# Patient Record
Sex: Female | Born: 1990 | Race: Black or African American | Hispanic: No | Marital: Single | Smoking: Former smoker
Health system: Southern US, Community
[De-identification: ages and names within clinical notes are randomized; demographics above are authoritative.]

## PROBLEM LIST (undated history)

## (undated) DIAGNOSIS — D649 Anemia, unspecified: Secondary | ICD-10-CM

## (undated) DIAGNOSIS — J45909 Unspecified asthma, uncomplicated: Secondary | ICD-10-CM

## (undated) HISTORY — PX: NO PAST SURGERIES: SHX2092

---
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
## 2010-01-31 ENCOUNTER — Emergency Department (HOSPITAL_COMMUNITY): Admission: EM | Admit: 2010-01-31 | Discharge: 2010-01-31 | Payer: Self-pay | Admitting: Emergency Medicine

## 2010-10-30 ENCOUNTER — Other Ambulatory Visit
Admission: RE | Admit: 2010-10-30 | Discharge: 2010-10-30 | Payer: Self-pay | Source: Home / Self Care | Admitting: Family Medicine

## 2011-02-17 LAB — URINALYSIS, ROUTINE W REFLEX MICROSCOPIC
Bilirubin Urine: NEGATIVE
Glucose, UA: NEGATIVE mg/dL
Hgb urine dipstick: NEGATIVE
Nitrite: NEGATIVE
Protein, ur: NEGATIVE mg/dL
Specific Gravity, Urine: 1.017 (ref 1.005–1.030)
Urobilinogen, UA: 0.2 mg/dL (ref 0.0–1.0)
pH: 5.5 (ref 5.0–8.0)

## 2011-02-17 LAB — WET PREP, GENITAL

## 2011-02-17 LAB — URINE MICROSCOPIC-ADD ON

## 2011-02-17 LAB — URINE CULTURE

## 2011-02-17 LAB — GC/CHLAMYDIA PROBE AMP, GENITAL
Chlamydia, DNA Probe: POSITIVE — AB
GC Probe Amp, Genital: NEGATIVE

## 2013-11-24 NOTE — L&D Delivery Note (Signed)
I have seen and examined this patient and I agree with the above. Cam HaiSHAW, KIMBERLY CNM 12:36 AM 06/22/2014

## 2013-11-24 NOTE — L&D Delivery Note (Signed)
Delivery Note At 2352 a healthy female was delivered via spontaneous vaginal delivery  (Presentation:occiput anterior ;  ).  Placenta status: Intact .  Cord: 3 vessel  with the following complications: None  Called to bedside when patient was complete and pushing. After 30 minutes, head was delivered OA over an intact perineum. There was a loose nuchal cord that was easily reduced. Shoulders were delivered and body was extracted from the vagina. Cord was cut and clamped and baby was handed to mom for skin to skin. Using gentle traction, cord and placenta were delivered and found to be intact. Cord blood was obtained. There was significant vaginal bleeding that did not immediately improve with uterine massage and 1000mcg cytotec was given orally. Bleeding slowed and she was noted to have a 1st degree vaginal laceration that was bleeding and repaired with 3-0 vicryl in usual fashion. Vaginal bleeding was very slow afterwards. Uterus was firm. Lap and needle count were correct. Mom and baby left in good condition. Will check again to see if bleeding resolved. Check CBC in the morning. Mom and baby to postpartum.  Anesthesia:  Epidural Episiotomy: None Lacerations: 1st degree vaginal Suture Repair: 3.0 Est. Blood Loss (mL): 450  Mom to postpartum.  Baby to Nursery.  Delivery attended by Francene FindersKimberly Shaw  Germani Gavilanes A 06/22/2014, 12:25 AM

## 2014-02-22 LAB — OB RESULTS CONSOLE RUBELLA ANTIBODY, IGM: Rubella: IMMUNE

## 2014-02-22 LAB — OB RESULTS CONSOLE ABO/RH: RH Type: POSITIVE

## 2014-02-22 LAB — OB RESULTS CONSOLE HEPATITIS B SURFACE ANTIGEN: Hepatitis B Surface Ag: NEGATIVE

## 2014-02-22 LAB — OB RESULTS CONSOLE RPR: RPR: NONREACTIVE

## 2014-02-22 LAB — OB RESULTS CONSOLE HIV ANTIBODY (ROUTINE TESTING): HIV: NONREACTIVE

## 2014-02-22 LAB — OB RESULTS CONSOLE ANTIBODY SCREEN: ANTIBODY SCREEN: NEGATIVE

## 2014-05-22 ENCOUNTER — Encounter: Payer: Self-pay | Admitting: Obstetrics & Gynecology

## 2014-05-22 ENCOUNTER — Ambulatory Visit (INDEPENDENT_AMBULATORY_CARE_PROVIDER_SITE_OTHER): Payer: Medicaid Other | Admitting: Obstetrics & Gynecology

## 2014-05-22 VITALS — BP 120/84 | HR 72 | Ht 70.0 in | Wt 198.0 lb

## 2014-05-22 DIAGNOSIS — Z3685 Encounter for antenatal screening for Streptococcus B: Secondary | ICD-10-CM

## 2014-05-22 DIAGNOSIS — Z3400 Encounter for supervision of normal first pregnancy, unspecified trimester: Secondary | ICD-10-CM

## 2014-05-22 DIAGNOSIS — Z3403 Encounter for supervision of normal first pregnancy, third trimester: Secondary | ICD-10-CM

## 2014-05-22 DIAGNOSIS — Z113 Encounter for screening for infections with a predominantly sexual mode of transmission: Secondary | ICD-10-CM

## 2014-05-22 LAB — OB RESULTS CONSOLE GC/CHLAMYDIA
Chlamydia: NEGATIVE
Gonorrhea: NEGATIVE

## 2014-05-22 LAB — OB RESULTS CONSOLE GBS: STREP GROUP B AG: NEGATIVE

## 2014-05-22 NOTE — Progress Notes (Signed)
[facility] requested. Cervical cultures today. Labor precautions reviewed.

## 2014-05-22 NOTE — Progress Notes (Signed)
[facility]. Records have been requested.

## 2014-05-23 LAB — GC/CHLAMYDIA PROBE AMP
CT PROBE, AMP APTIMA: NEGATIVE
GC PROBE AMP APTIMA: NEGATIVE

## 2014-05-24 LAB — CULTURE, BETA STREP (GROUP B ONLY)

## 2014-05-29 ENCOUNTER — Ambulatory Visit (INDEPENDENT_AMBULATORY_CARE_PROVIDER_SITE_OTHER): Payer: Medicaid Other | Admitting: Obstetrics & Gynecology

## 2014-05-29 VITALS — BP 120/82 | HR 68 | Wt 200.0 lb

## 2014-05-29 DIAGNOSIS — Z3490 Encounter for supervision of normal pregnancy, unspecified, unspecified trimester: Secondary | ICD-10-CM | POA: Insufficient documentation

## 2014-05-29 DIAGNOSIS — Z3400 Encounter for supervision of normal first pregnancy, unspecified trimester: Secondary | ICD-10-CM

## 2014-05-29 DIAGNOSIS — Z3493 Encounter for supervision of normal pregnancy, unspecified, third trimester: Secondary | ICD-10-CM

## 2014-05-29 NOTE — Progress Notes (Signed)
No records from [facility] yet, will investigate.  No cervical change since last week.  No other complaints or concerns.  Fetal movement and labor precautions reviewed.

## 2014-05-29 NOTE — Patient Instructions (Signed)
Return to clinic for any obstetric concerns or go to MAU for evaluation  

## 2014-06-01 ENCOUNTER — Encounter: Payer: Self-pay | Admitting: Obstetrics & Gynecology

## 2014-06-05 ENCOUNTER — Encounter: Payer: Self-pay | Admitting: Obstetrics and Gynecology

## 2014-06-05 ENCOUNTER — Ambulatory Visit (INDEPENDENT_AMBULATORY_CARE_PROVIDER_SITE_OTHER): Payer: Medicaid Other | Admitting: Obstetrics and Gynecology

## 2014-06-05 VITALS — BP 110/77 | HR 68 | Wt 200.0 lb

## 2014-06-05 DIAGNOSIS — D573 Sickle-cell trait: Secondary | ICD-10-CM

## 2014-06-05 DIAGNOSIS — Z3493 Encounter for supervision of normal pregnancy, unspecified, third trimester: Secondary | ICD-10-CM

## 2014-06-05 DIAGNOSIS — Z3480 Encounter for supervision of other normal pregnancy, unspecified trimester: Secondary | ICD-10-CM

## 2014-06-05 NOTE — Progress Notes (Signed)
Patient is doing well without complaints. FM/labor precautions reviewed 

## 2014-06-12 ENCOUNTER — Ambulatory Visit (INDEPENDENT_AMBULATORY_CARE_PROVIDER_SITE_OTHER): Payer: Medicaid Other | Admitting: Obstetrics & Gynecology

## 2014-06-12 VITALS — BP 121/83 | HR 60 | Wt 204.0 lb

## 2014-06-12 DIAGNOSIS — Z3400 Encounter for supervision of normal first pregnancy, unspecified trimester: Secondary | ICD-10-CM

## 2014-06-12 DIAGNOSIS — Z3493 Encounter for supervision of normal pregnancy, unspecified, third trimester: Secondary | ICD-10-CM

## 2014-06-12 NOTE — Progress Notes (Signed)
[facility].  Nml glucola and anatomy.

## 2014-06-19 ENCOUNTER — Ambulatory Visit (INDEPENDENT_AMBULATORY_CARE_PROVIDER_SITE_OTHER): Payer: Medicaid Other | Admitting: Obstetrics & Gynecology

## 2014-06-19 VITALS — BP 112/82 | HR 59 | Wt 207.0 lb

## 2014-06-19 DIAGNOSIS — Z3480 Encounter for supervision of other normal pregnancy, unspecified trimester: Secondary | ICD-10-CM

## 2014-06-19 DIAGNOSIS — Z3493 Encounter for supervision of normal pregnancy, unspecified, third trimester: Secondary | ICD-10-CM

## 2014-06-19 NOTE — Progress Notes (Signed)
Routine visit. Good FM. Labor precautions reviewed. Start testing at next visit.

## 2014-06-21 ENCOUNTER — Inpatient Hospital Stay (HOSPITAL_COMMUNITY)
Admission: AD | Admit: 2014-06-21 | Discharge: 2014-06-21 | Disposition: A | Discharge status: Home / Self Care | Payer: Medicaid Other | Source: Ambulatory Visit | Attending: Obstetrics & Gynecology | Admitting: Obstetrics & Gynecology

## 2014-06-21 ENCOUNTER — Encounter (HOSPITAL_COMMUNITY): Payer: Self-pay | Admitting: *Deleted

## 2014-06-21 ENCOUNTER — Encounter (HOSPITAL_COMMUNITY): Payer: Medicaid Other | Admitting: Anesthesiology

## 2014-06-21 ENCOUNTER — Inpatient Hospital Stay (HOSPITAL_COMMUNITY)
Admission: AD | Admit: 2014-06-21 | Discharge: 2014-06-23 | DRG: 775 | Disposition: A | Discharge status: Home / Self Care | Payer: Medicaid Other | Source: Ambulatory Visit | Attending: Obstetrics and Gynecology | Admitting: Obstetrics and Gynecology

## 2014-06-21 ENCOUNTER — Inpatient Hospital Stay (HOSPITAL_COMMUNITY): Payer: Medicaid Other | Admitting: Anesthesiology

## 2014-06-21 DIAGNOSIS — J45909 Unspecified asthma, uncomplicated: Secondary | ICD-10-CM | POA: Diagnosis present

## 2014-06-21 DIAGNOSIS — O479 False labor, unspecified: Secondary | ICD-10-CM | POA: Insufficient documentation

## 2014-06-21 DIAGNOSIS — Z87891 Personal history of nicotine dependence: Secondary | ICD-10-CM

## 2014-06-21 DIAGNOSIS — Z880 Allergy status to penicillin: Secondary | ICD-10-CM | POA: Diagnosis not present

## 2014-06-21 DIAGNOSIS — IMO0001 Reserved for inherently not codable concepts without codable children: Secondary | ICD-10-CM

## 2014-06-21 DIAGNOSIS — Z3493 Encounter for supervision of normal pregnancy, unspecified, third trimester: Secondary | ICD-10-CM

## 2014-06-21 HISTORY — DX: Anemia, unspecified: D64.9

## 2014-06-21 HISTORY — DX: Unspecified asthma, uncomplicated: J45.909

## 2014-06-21 LAB — CBC
HCT: 35.5 % — ABNORMAL LOW (ref 36.0–46.0)
HEMOGLOBIN: 12.7 g/dL (ref 12.0–15.0)
MCH: 33.2 pg (ref 26.0–34.0)
MCHC: 35.8 g/dL (ref 30.0–36.0)
MCV: 92.9 fL (ref 78.0–100.0)
Platelets: 252 10*3/uL (ref 150–400)
RBC: 3.82 MIL/uL — ABNORMAL LOW (ref 3.87–5.11)
RDW: 12.3 % (ref 11.5–15.5)
WBC: 10.6 10*3/uL — AB (ref 4.0–10.5)

## 2014-06-21 MED ORDER — LACTATED RINGERS IV SOLN
INTRAVENOUS | Status: DC
  Administered 2014-06-21: 23:00:00 via INTRAVENOUS

## 2014-06-21 MED ORDER — FENTANYL CITRATE 0.05 MG/ML IJ SOLN
INTRAMUSCULAR | Status: AC
  Administered 2014-06-21: 100 ug via INTRAVENOUS
  Filled 2014-06-21: qty 2

## 2014-06-21 MED ORDER — FENTANYL 2.5 MCG/ML BUPIVACAINE 1/10 % EPIDURAL INFUSION (WH - ANES)
INTRAMUSCULAR | Status: DC | PRN
  Administered 2014-06-21: 14 mL/h via EPIDURAL

## 2014-06-21 MED ORDER — FENTANYL 2.5 MCG/ML BUPIVACAINE 1/10 % EPIDURAL INFUSION (WH - ANES)
14.0000 mL/h | INTRAMUSCULAR | Status: DC | PRN
  Administered 2014-06-21: 14 mL/h via EPIDURAL

## 2014-06-21 MED ORDER — CITRIC ACID-SODIUM CITRATE 334-500 MG/5ML PO SOLN: 30.0000 mL | ORAL | Status: DC | PRN

## 2014-06-21 MED ORDER — LACTATED RINGERS IV SOLN: 500.0000 mL | INTRAVENOUS | Status: DC | PRN

## 2014-06-21 MED ORDER — EPHEDRINE 5 MG/ML INJ: 10.0000 mg | INTRAVENOUS | Status: DC | PRN

## 2014-06-21 MED ORDER — PHENYLEPHRINE 40 MCG/ML (10ML) SYRINGE FOR IV PUSH (FOR BLOOD PRESSURE SUPPORT): 80.0000 ug | PREFILLED_SYRINGE | INTRAVENOUS | Status: DC | PRN

## 2014-06-21 MED ORDER — PHENYLEPHRINE 40 MCG/ML (10ML) SYRINGE FOR IV PUSH (FOR BLOOD PRESSURE SUPPORT)
PREFILLED_SYRINGE | INTRAVENOUS | Status: AC
  Filled 2014-06-21: qty 10

## 2014-06-21 MED ORDER — ONDANSETRON HCL 4 MG/2ML IJ SOLN
4.0000 mg | Freq: Four times a day (QID) | INTRAMUSCULAR | Status: DC | PRN
Start: 2014-06-21 — End: 2014-06-22

## 2014-06-21 MED ORDER — FENTANYL 2.5 MCG/ML BUPIVACAINE 1/10 % EPIDURAL INFUSION (WH - ANES)
INTRAMUSCULAR | Status: AC
  Administered 2014-06-21: 23:00:00
  Filled 2014-06-21: qty 125

## 2014-06-21 MED ORDER — ACETAMINOPHEN 325 MG PO TABS: 650.0000 mg | ORAL_TABLET | ORAL | Status: DC | PRN

## 2014-06-21 MED ORDER — OXYTOCIN BOLUS FROM INFUSION
500.0000 mL | INTRAVENOUS | Status: DC
  Administered 2014-06-22: 500 mL via INTRAVENOUS

## 2014-06-21 MED ORDER — DIPHENHYDRAMINE HCL 50 MG/ML IJ SOLN: 12.5000 mg | INTRAMUSCULAR | Status: DC | PRN

## 2014-06-21 MED ORDER — OXYCODONE-ACETAMINOPHEN 5-325 MG PO TABS: 1.0000 | ORAL_TABLET | ORAL | Status: DC | PRN

## 2014-06-21 MED ORDER — IBUPROFEN 600 MG PO TABS: 600.0000 mg | ORAL_TABLET | Freq: Four times a day (QID) | ORAL | Status: DC | PRN

## 2014-06-21 MED ORDER — LIDOCAINE HCL (PF) 1 % IJ SOLN
INTRAMUSCULAR | Status: DC | PRN
  Administered 2014-06-21: 5 mL
  Administered 2014-06-21: 3 mL
  Administered 2014-06-21: 5 mL

## 2014-06-21 MED ORDER — FENTANYL CITRATE 0.05 MG/ML IJ SOLN
100.0000 ug | INTRAMUSCULAR | Status: DC | PRN
  Administered 2014-06-21: 100 ug via INTRAVENOUS

## 2014-06-21 MED ORDER — LIDOCAINE HCL (PF) 1 % IJ SOLN
30.0000 mL | INTRAMUSCULAR | Status: DC | PRN
  Administered 2014-06-22: 30 mL via SUBCUTANEOUS
  Filled 2014-06-21: qty 30

## 2014-06-21 MED ORDER — OXYTOCIN 40 UNITS IN LACTATED RINGERS INFUSION - SIMPLE MED
62.5000 mL/h | INTRAVENOUS | Status: DC
  Filled 2014-06-21: qty 1000

## 2014-06-21 MED ORDER — LACTATED RINGERS IV SOLN
500.0000 mL | Freq: Once | INTRAVENOUS | Status: AC
  Administered 2014-06-21: 500 mL via INTRAVENOUS

## 2014-06-21 NOTE — MAU Note (Signed)
Pt reports she has had ctx all day here in am 3cm. Deneis SROM or vag bleeding at this time.

## 2014-06-21 NOTE — MAU Note (Signed)
Contractions started at midnight. "back to back, but have now slowed". Small amt of bloody mucous. No leaking.  First preg; no problems was 1+ when last checked.

## 2014-06-21 NOTE — Anesthesia Procedure Notes (Signed)
Epidural Patient location during procedure: OB  Staffing Anesthesiologist: Phillips GroutARIGNAN, Kegan Mckeithan Performed by: anesthesiologist   Preanesthetic Checklist Completed: patient identified, site marked, surgical consent, pre-op evaluation, timeout performed, IV checked, risks and benefits discussed and monitors and equipment checked  Epidural Patient position: sitting Prep: ChloraPrep Patient monitoring: heart rate, continuous pulse ox and blood pressure Approach: midline Location: L3-L4 Injection technique: LOR saline  Needle:  Needle type: Tuohy  Needle gauge: 17 G Needle length: 9 cm and 9 Needle insertion depth: 4 cm Catheter type: closed end flexible Catheter size: 20 Guage Catheter at skin depth: 9 cm Test dose: negative  Assessment Events: blood not aspirated, injection not painful, no injection resistance, negative IV test and no paresthesia  Additional Notes   Patient tolerated the insertion well without complications.

## 2014-06-21 NOTE — MAU Provider Note (Signed)
Mckenzie DossShanteria N Mccoy is a 23 y.o. female G1P0 at 3285w1d gestation presenting for contractions. Maternal Medical History:  Reason for admission: Nausea.     Patient began having contractions at midnight last night. She was checked in the MAU and found to be 2 cm. She returned when contractions became more severe and frequent. Now every 5 minutes and she cannot tolerate them any longer. No leakage of fluid, no vaginal discharge. Some very minimal spotting. No headaches. Patient is GBS negative. No complications during this pregnancy. Patient desires epidural.  OB History   Grav Para Term Preterm Abortions TAB SAB Ect Mult Living   1         0     Past Medical History  Diagnosis Date  . Asthma   . Anemia    Past Surgical History  Procedure Laterality Date  . No past surgeries     Family History: family history is not on file. Social History:  reports that she quit smoking about 2 months ago. Her smoking use included Cigarettes. She smoked 0.25 packs per day. She does not have any smokeless tobacco history on file. She reports that she does not drink alcohol or use illicit drugs.   Prenatal Transfer Tool  Maternal Diabetes: No Genetic Screening: Normal Maternal Ultrasounds/Referrals: Normal Fetal Ultrasounds or other Referrals:  None Maternal Substance Abuse:  No Significant Maternal Medications:  None Significant Maternal Lab Results:  None Other Comments:  None  Review of Systems  Constitutional: Negative for fever and chills.  HENT: Negative for hearing loss.   Eyes: Negative for blurred vision and double vision.  Respiratory: Negative for cough and sputum production.   Cardiovascular: Negative for chest pain and palpitations.  Gastrointestinal: Positive for abdominal pain. Negative for heartburn, nausea, vomiting and diarrhea.  Genitourinary: Negative for dysuria and urgency.  Musculoskeletal: Negative for myalgias.  Neurological: Negative for dizziness, tingling and  headaches.    Dilation: 4 Effacement (%): 90 Station: -1 Exam by:: K.WIlson,RN Last menstrual period 09/13/2013. Exam Physical Exam  Constitutional: She is oriented to person, place, and time. She appears well-developed and well-nourished.  23 year old female on hands and knees, appears very uncomfortable  Cardiovascular: Normal rate and regular rhythm.   Respiratory: Effort normal and breath sounds normal.  GI: Soft.  Gravid, non-tender  Genitourinary: Vagina normal.  Musculoskeletal: She exhibits edema. She exhibits no tenderness.  Neurological: She is alert and oriented to person, place, and time.  Skin: Skin is warm and dry.    Prenatal labs: ABO, Rh: B/Positive/-- (04/01 0000) Antibody: Negative (04/01 0000) Rubella: Immune (04/01 0000) RPR: Nonreactive (04/01 0000)  HBsAg: Negative (04/01 0000)  HIV: Non-reactive (04/01 0000)  GBS: Negative (06/29 0000)   Assessment/Plan: 23 year old G1P0 female in active labor. She has made progress since this morning and now very severe.  #Labor- Admit to L&D. Continuous fetal monitoring. CBC and RPR. #Anesthesia- Fentanyl and order epidural #GBS negative  Patient seen with Francene FindersKimberly Shaw Stephens, Devin A 06/21/2014, 9:14 PM  I have seen and examined this patient and I agree with the above. Cam HaiSHAW, KIMBERLY CNM 12:34 AM 06/22/2014

## 2014-06-21 NOTE — Discharge Instructions (Signed)
Third Trimester of Pregnancy °The third trimester is from week 29 through week 42, months 7 through 9. The third trimester is a time when the fetus is growing rapidly. At the end of the ninth month, the fetus is about 20 inches in length and weighs 6-10 pounds.  °BODY CHANGES °Your body goes through many changes during pregnancy. The changes vary from woman to woman.  °· Your weight will continue to increase. You can expect to gain 25-35 pounds (11-16 kg) by the end of the pregnancy. °· You may begin to get stretch marks on your hips, abdomen, and breasts. °· You may urinate more often because the fetus is moving lower into your pelvis and pressing on your bladder. °· You may develop or continue to have heartburn as a result of your pregnancy. °· You may develop constipation because certain hormones are causing the muscles that push waste through your intestines to slow down. °· You may develop hemorrhoids or swollen, bulging veins (varicose veins). °· You may have pelvic pain because of the weight gain and pregnancy hormones relaxing your joints between the bones in your pelvis. Backaches may result from overexertion of the muscles supporting your posture. °· You may have changes in your hair. These can include thickening of your hair, rapid growth, and changes in texture. Some women also have hair loss during or after pregnancy, or hair that feels dry or thin. Your hair will most likely return to normal after your baby is born. °· Your breasts will continue to grow and be tender. A yellow discharge may leak from your breasts called colostrum. °· Your belly button may stick out. °· You may feel short of breath because of your expanding uterus. °· You may notice the fetus "dropping," or moving lower in your abdomen. °· You may have a bloody mucus discharge. This usually occurs a few days to a week before labor begins. °· Your cervix becomes thin and soft (effaced) near your due date. °WHAT TO EXPECT AT YOUR PRENATAL  EXAMS  °You will have prenatal exams every 2 weeks until week 36. Then, you will have weekly prenatal exams. During a routine prenatal visit: °· You will be weighed to make sure you and the fetus are growing normally. °· Your blood pressure is taken. °· Your abdomen will be measured to track your baby's growth. °· The fetal heartbeat will be listened to. °· Any test results from the previous visit will be discussed. °· You may have a cervical check near your due date to see if you have effaced. °At around 36 weeks, your caregiver will check your cervix. At the same time, your caregiver will also perform a test on the secretions of the vaginal tissue. This test is to determine if a type of bacteria, Group B streptococcus, is present. Your caregiver will explain this further. °Your caregiver may ask you: °· What your birth plan is. °· How you are feeling. °· If you are feeling the baby move. °· If you have had any abnormal symptoms, such as leaking fluid, bleeding, severe headaches, or abdominal cramping. °· If you have any questions. °Other tests or screenings that may be performed during your third trimester include: °· Blood tests that check for low iron levels (anemia). °· Fetal testing to check the health, activity level, and growth of the fetus. Testing is done if you have certain medical conditions or if there are problems during the pregnancy. °FALSE LABOR °You may feel small, irregular contractions that   eventually go away. These are called Braxton Hicks contractions, or false labor. Contractions may last for hours, days, or even weeks before true labor sets in. If contractions come at regular intervals, intensify, or become painful, it is best to be seen by your caregiver.  °SIGNS OF LABOR  °· Menstrual-like cramps. °· Contractions that are 5 minutes apart or less. °· Contractions that start on the top of the uterus and spread down to the lower abdomen and back. °· A sense of increased pelvic pressure or back  pain. °· A watery or bloody mucus discharge that comes from the vagina. °If you have any of these signs before the 37th week of pregnancy, call your caregiver right away. You need to go to the hospital to get checked immediately. °HOME CARE INSTRUCTIONS  °· Avoid all smoking, herbs, alcohol, and unprescribed drugs. These chemicals affect the formation and growth of the baby. °· Follow your caregiver's instructions regarding medicine use. There are medicines that are either safe or unsafe to take during pregnancy. °· Exercise only as directed by your caregiver. Experiencing uterine cramps is a good sign to stop exercising. °· Continue to eat regular, healthy meals. °· Wear a good support bra for breast tenderness. °· Do not use hot tubs, steam rooms, or saunas. °· Wear your seat belt at all times when driving. °· Avoid raw meat, uncooked cheese, cat litter boxes, and soil used by cats. These carry germs that can cause birth defects in the baby. °· Take your prenatal vitamins. °· Try taking a stool softener (if your caregiver approves) if you develop constipation. Eat more high-fiber foods, such as fresh vegetables or fruit and whole grains. Drink plenty of fluids to keep your urine clear or pale yellow. °· Take warm sitz baths to soothe any pain or discomfort caused by hemorrhoids. Use hemorrhoid cream if your caregiver approves. °· If you develop varicose veins, wear support hose. Elevate your feet for 15 minutes, 3-4 times a day. Limit salt in your diet. °· Avoid heavy lifting, wear low heal shoes, and practice good posture. °· Rest a lot with your legs elevated if you have leg cramps or low back pain. °· Visit your dentist if you have not gone during your pregnancy. Use a soft toothbrush to brush your teeth and be gentle when you floss. °· A sexual relationship may be continued unless your caregiver directs you otherwise. °· Do not travel far distances unless it is absolutely necessary and only with the approval  of your caregiver. °· Take prenatal classes to understand, practice, and ask questions about the labor and delivery. °· Make a trial run to the hospital. °· Pack your hospital bag. °· Prepare the baby's nursery. °· Continue to go to all your prenatal visits as directed by your caregiver. °SEEK MEDICAL CARE IF: °· You are unsure if you are in labor or if your water has broken. °· You have dizziness. °· You have mild pelvic cramps, pelvic pressure, or nagging pain in your abdominal area. °· You have persistent nausea, vomiting, or diarrhea. °· You have a bad smelling vaginal discharge. °· You have pain with urination. °SEEK IMMEDIATE MEDICAL CARE IF:  °· You have a fever. °· You are leaking fluid from your vagina. °· You have spotting or bleeding from your vagina. °· You have severe abdominal cramping or pain. °· You have rapid weight loss or gain. °· You have shortness of breath with chest pain. °· You notice sudden or extreme swelling   of your face, hands, ankles, feet, or legs. °· You have not felt your baby move in over an hour. °· You have severe headaches that do not go away with medicine. °· You have vision changes. °Document Released: 11/04/2001 Document Revised: 11/15/2013 Document Reviewed: 01/11/2013 °[employer]® Patient Information ©2015 [employer]. This information is not intended to replace advice given to you by your health care provider. Make sure you discuss any questions you have with your health care provider. °Fetal Movement Counts °Patient Name: __________________________________________________ Patient Due Date: ____________________ °Performing a fetal movement count is highly recommended in high-risk pregnancies, but it is good for every pregnant woman to do. Your health care provider may ask you to start counting fetal movements at 28 weeks of the pregnancy. Fetal movements often increase: °· After eating a full meal. °· After physical activity. °· After eating or drinking something sweet or  cold. °· At rest. °Pay attention to when you feel the baby is most active. This will help you notice a pattern of your baby's sleep and wake cycles and what factors contribute to an increase in fetal movement. It is important to perform a fetal movement count at the same time each day when your baby is normally most active.  °HOW TO COUNT FETAL MOVEMENTS °1. Find a quiet and comfortable area to sit or lie down on your left side. Lying on your left side provides the best blood and oxygen circulation to your baby. °2. Write down the day and time on a sheet of paper or in a journal. °3. Start counting kicks, flutters, swishes, rolls, or jabs in a 2-hour period. You should feel at least 10 movements within 2 hours. °4. If you do not feel 10 movements in 2 hours, wait 2-3 hours and count again. Look for a change in the pattern or not enough counts in 2 hours. °SEEK MEDICAL CARE IF: °· You feel less than 10 counts in 2 hours, tried twice. °· There is no movement in over an hour. °· The pattern is changing or taking longer each day to reach 10 counts in 2 hours. °· You feel the baby is not moving as he or she usually does. °Date: ____________ Movements: ____________ Start time: ____________ Finish time: ____________  °Date: ____________ Movements: ____________ Start time: ____________ Finish time: ____________ °Date: ____________ Movements: ____________ Start time: ____________ Finish time: ____________ °Date: ____________ Movements: ____________ Start time: ____________ Finish time: ____________ °Date: ____________ Movements: ____________ Start time: ____________ Finish time: ____________ °Date: ____________ Movements: ____________ Start time: ____________ Finish time: ____________ °Date: ____________ Movements: ____________ Start time: ____________ Finish time: ____________ °Date: ____________ Movements: ____________ Start time: ____________ Finish time: ____________  °Date: ____________ Movements: ____________ Start  time: ____________ Finish time: ____________ °Date: ____________ Movements: ____________ Start time: ____________ Finish time: ____________ °Date: ____________ Movements: ____________ Start time: ____________ Finish time: ____________ °Date: ____________ Movements: ____________ Start time: ____________ Finish time: ____________ °Date: ____________ Movements: ____________ Start time: ____________ Finish time: ____________ °Date: ____________ Movements: ____________ Start time: ____________ Finish time: ____________ °Date: ____________ Movements: ____________ Start time: ____________ Finish time: ____________  °Date: ____________ Movements: ____________ Start time: ____________ Finish time: ____________ °Date: ____________ Movements: ____________ Start time: ____________ Finish time: ____________ °Date: ____________ Movements: ____________ Start time: ____________ Finish time: ____________ °Date: ____________ Movements: ____________ Start time: ____________ Finish time: ____________ °Date: ____________ Movements: ____________ Start time: ____________ Finish time: ____________ °Date: ____________ Movements: ____________ Start time: ____________ Finish time: ____________ °Date: ____________ Movements: ____________ Start time: ____________ Finish time:   ____________  °Date: ____________ Movements: ____________ Start time: ____________ Finish time: ____________ °Date: ____________ Movements: ____________ Start time: ____________ Finish time: ____________ °Date: ____________ Movements: ____________ Start time: ____________ Finish time: ____________ °Date: ____________ Movements: ____________ Start time: ____________ Finish time: ____________ °Date: ____________ Movements: ____________ Start time: ____________ Finish time: ____________ °Date: ____________ Movements: ____________ Start time: ____________ Finish time: ____________ °Date: ____________ Movements: ____________ Start time: ____________ Finish time: ____________    °Date: ____________ Movements: ____________ Start time: ____________ Finish time: ____________ °Date: ____________ Movements: ____________ Start time: ____________ Finish time: ____________ °Date: ____________ Movements: ____________ Start time: ____________ Finish time: ____________ °Date: ____________ Movements: ____________ Start time: ____________ Finish time: ____________ °Date: ____________ Movements: ____________ Start time: ____________ Finish time: ____________ °Date: ____________ Movements: ____________ Start time: ____________ Finish time: ____________ °Date: ____________ Movements: ____________ Start time: ____________ Finish time: ____________  °Date: ____________ Movements: ____________ Start time: ____________ Finish time: ____________ °Date: ____________ Movements: ____________ Start time: ____________ Finish time: ____________ °Date: ____________ Movements: ____________ Start time: ____________ Finish time: ____________ °Date: ____________ Movements: ____________ Start time: ____________ Finish time: ____________ °Date: ____________ Movements: ____________ Start time: ____________ Finish time: ____________ °Date: ____________ Movements: ____________ Start time: ____________ Finish time: ____________ °Date: ____________ Movements: ____________ Start time: ____________ Finish time: ____________  °Date: ____________ Movements: ____________ Start time: ____________ Finish time: ____________ °Date: ____________ Movements: ____________ Start time: ____________ Finish time: ____________ °Date: ____________ Movements: ____________ Start time: ____________ Finish time: ____________ °Date: ____________ Movements: ____________ Start time: ____________ Finish time: ____________ °Date: ____________ Movements: ____________ Start time: ____________ Finish time: ____________ °Date: ____________ Movements: ____________ Start time: ____________ Finish time: ____________ °Date: ____________ Movements: ____________  Start time: ____________ Finish time: ____________  °Date: ____________ Movements: ____________ Start time: ____________ Finish time: ____________ °Date: ____________ Movements: ____________ Start time: ____________ Finish time: ____________ °Date: ____________ Movements: ____________ Start time: ____________ Finish time: ____________ °Date: ____________ Movements: ____________ Start time: ____________ Finish time: ____________ °Date: ____________ Movements: ____________ Start time: ____________ Finish time: ____________ °Date: ____________ Movements: ____________ Start time: ____________ Finish time: ____________ °Document Released: 12/10/2006 Document Revised: 03/27/2014 Document Reviewed: 09/06/2012 °[employer]® Patient Information ©2015 [employer]. This information is not intended to replace advice given to you by your health care provider. Make sure you discuss any questions you have with your health care provider. ° °

## 2014-06-21 NOTE — Anesthesia Preprocedure Evaluation (Signed)
Anesthesia Evaluation  Patient identified by MRN, date of birth, ID band Patient awake    Reviewed: Allergy & Precautions, H&P , NPO status , Patient's Chart, lab work & pertinent test results  History of Anesthesia Complications Negative for: history of anesthetic complications  Airway Mallampati: II TM Distance: >3 FB Neck ROM: full    Dental no notable dental hx. (+) Teeth Intact   Pulmonary neg pulmonary ROS, former smoker,  breath sounds clear to auscultation  Pulmonary exam normal       Cardiovascular negative cardio ROS  Rhythm:regular Rate:Normal     Neuro/Psych negative neurological ROS  negative psych ROS   GI/Hepatic negative GI ROS, Neg liver ROS,   Endo/Other  negative endocrine ROS  Renal/GU negative Renal ROS  negative genitourinary   Musculoskeletal   Abdominal Normal abdominal exam  (+)   Peds  Hematology negative hematology ROS (+)   Anesthesia Other Findings   Reproductive/Obstetrics (+) Pregnancy                           Anesthesia Physical Anesthesia Plan  ASA: II  Anesthesia Plan: Epidural   Post-op Pain Management:    Induction:   Airway Management Planned:   Additional Equipment:   Intra-op Plan:   Post-operative Plan:   Informed Consent: I have reviewed the patients History and Physical, chart, labs and discussed the procedure including the risks, benefits and alternatives for the proposed anesthesia with the patient or authorized representative who has indicated his/her understanding and acceptance.     Plan Discussed with:   Anesthesia Plan Comments:         Anesthesia Quick Evaluation  

## 2014-06-22 ENCOUNTER — Encounter (HOSPITAL_COMMUNITY): Payer: Self-pay | Admitting: *Deleted

## 2014-06-22 LAB — CBC
HCT: 26.6 % — ABNORMAL LOW (ref 36.0–46.0)
Hemoglobin: 9.5 g/dL — ABNORMAL LOW (ref 12.0–15.0)
MCH: 33.2 pg (ref 26.0–34.0)
MCHC: 35.7 g/dL (ref 30.0–36.0)
MCV: 93 fL (ref 78.0–100.0)
PLATELETS: 230 10*3/uL (ref 150–400)
RBC: 2.86 MIL/uL — AB (ref 3.87–5.11)
RDW: 12.3 % (ref 11.5–15.5)
WBC: 15.4 10*3/uL — AB (ref 4.0–10.5)

## 2014-06-22 LAB — RPR

## 2014-06-22 MED ORDER — SIMETHICONE 80 MG PO CHEW: 80.0000 mg | CHEWABLE_TABLET | ORAL | Status: DC | PRN

## 2014-06-22 MED ORDER — DIBUCAINE 1 % RE OINT
1.0000 "application " | TOPICAL_OINTMENT | RECTAL | Status: DC | PRN
  Filled 2014-06-22: qty 28

## 2014-06-22 MED ORDER — MISOPROSTOL 200 MCG PO TABS
1000.0000 ug | ORAL_TABLET | Freq: Once | ORAL | Status: AC
  Administered 2014-06-22: 1000 ug via ORAL

## 2014-06-22 MED ORDER — PNEUMOCOCCAL VAC POLYVALENT 25 MCG/0.5ML IJ INJ
0.5000 mL | INJECTION | INTRAMUSCULAR | Status: AC
  Administered 2014-06-23: 0.5 mL via INTRAMUSCULAR
  Filled 2014-06-22: qty 0.5

## 2014-06-22 MED ORDER — DIPHENHYDRAMINE HCL 25 MG PO CAPS: 25.0000 mg | ORAL_CAPSULE | Freq: Four times a day (QID) | ORAL | Status: DC | PRN

## 2014-06-22 MED ORDER — LANOLIN HYDROUS EX OINT: TOPICAL_OINTMENT | CUTANEOUS | Status: DC | PRN

## 2014-06-22 MED ORDER — ONDANSETRON HCL 4 MG PO TABS: 4.0000 mg | ORAL_TABLET | ORAL | Status: DC | PRN

## 2014-06-22 MED ORDER — BENZOCAINE-MENTHOL 20-0.5 % EX AERO
1.0000 "application " | INHALATION_SPRAY | CUTANEOUS | Status: DC | PRN
  Administered 2014-06-22 – 2014-06-23 (×2): 1 via TOPICAL
  Filled 2014-06-22 (×3): qty 56

## 2014-06-22 MED ORDER — ONDANSETRON HCL 4 MG/2ML IJ SOLN: 4.0000 mg | INTRAMUSCULAR | Status: DC | PRN

## 2014-06-22 MED ORDER — ZOLPIDEM TARTRATE 5 MG PO TABS: 5.0000 mg | ORAL_TABLET | Freq: Every evening | ORAL | Status: DC | PRN

## 2014-06-22 MED ORDER — TETANUS-DIPHTH-ACELL PERTUSSIS 5-2.5-18.5 LF-MCG/0.5 IM SUSP: 0.5000 mL | Freq: Once | INTRAMUSCULAR | Status: DC

## 2014-06-22 MED ORDER — WITCH HAZEL-GLYCERIN EX PADS: 1.0000 "application " | MEDICATED_PAD | CUTANEOUS | Status: DC | PRN

## 2014-06-22 MED ORDER — OXYCODONE-ACETAMINOPHEN 5-325 MG PO TABS: 1.0000 | ORAL_TABLET | ORAL | Status: DC | PRN

## 2014-06-22 MED ORDER — MISOPROSTOL 200 MCG PO TABS
ORAL_TABLET | ORAL | Status: AC
  Filled 2014-06-22: qty 5

## 2014-06-22 MED ORDER — IBUPROFEN 600 MG PO TABS
600.0000 mg | ORAL_TABLET | Freq: Four times a day (QID) | ORAL | Status: DC
  Administered 2014-06-22 – 2014-06-23 (×6): 600 mg via ORAL
  Filled 2014-06-22 (×6): qty 1

## 2014-06-22 MED ORDER — PRENATAL MULTIVITAMIN CH
1.0000 | ORAL_TABLET | Freq: Every day | ORAL | Status: DC
  Administered 2014-06-22 – 2014-06-23 (×2): 1 via ORAL
  Filled 2014-06-22 (×2): qty 1

## 2014-06-22 MED ORDER — SENNOSIDES-DOCUSATE SODIUM 8.6-50 MG PO TABS
2.0000 | ORAL_TABLET | ORAL | Status: DC
  Administered 2014-06-22: 2 via ORAL
  Filled 2014-06-22: qty 2

## 2014-06-22 NOTE — Anesthesia Postprocedure Evaluation (Signed)
  Anesthesia Post-op Note  Patient: Mckenzie DossShanteria N Benecke  Procedure(s) Performed: * No procedures listed *  Patient Location: Mother/Baby  Anesthesia Type:Epidural  Level of Consciousness: awake, alert , oriented and patient cooperative  Airway and Oxygen Therapy: Patient Spontanous Breathing  Post-op Pain: mild  Post-op Assessment: Patient's Cardiovascular Status Stable, Respiratory Function Stable, No headache, No backache, No residual numbness and No residual motor weakness  Post-op Vital Signs: stable  Last Vitals:  Filed Vitals:   06/22/14 0330  BP: 108/56  Pulse: 107  Temp: 37.3 C  Resp: 18    Complications: No apparent anesthesia complications

## 2014-06-22 NOTE — Progress Notes (Signed)
23 year old G1P1001 s/p vaginal delivery at 125w1d Post Partum Day 1 Subjective: no complaints, up ad lib, voiding, tolerating PO and + flatus  Objective: Blood pressure 108/56, pulse 107, temperature 99.1 F (37.3 C), temperature source Oral, resp. rate 18, height 5\' 10"  (1.778 m), weight 93.895 kg (207 lb), last menstrual period 09/13/2013, SpO2 96.00%, unknown if currently breastfeeding.  Physical Exam:  General: alert, appears stated age and no distress Lochia: appropriate Uterine Fundus: firm Incision: N/A DVT Evaluation: No evidence of DVT seen on physical exam. Negative Homan's sign. No cords or calf tenderness.   Recent Labs  06/21/14 2115 06/22/14 0555  HGB 12.7 9.5*  HCT 35.5* 26.6*    Assessment/Plan: Plan for discharge tomorrow and Breastfeeding  Patient would like Depo provera at discharge for contraception  Patient was seen alongside Fredirick LatheKristy Nayla Dias   LOS: 1 day   Markus JarvisStephens, Devin A 06/22/2014, 7:52 AM   I have seen and examined this patient and agree with above documentation in the resident's note.  - underwent in and out cath 0100, is to attempt urination this morning, if unable to urinate bladder scan and reevaluate.  Fredirick LatheKristy Samora Jernberg, MD OB Fellow 06/22/2014 10:11 AM

## 2014-06-22 NOTE — Lactation Note (Signed)
This note was copied from the chart of Mckenzie Mickle [name]. Lactation Consultation Note: Initial visit with mom. She reports that baby fed well at 7:30 am but has not fed since. Mom states she needs help with positioning baby  Unwrapped and undressed baby. Assisted mom in football position but baby too sleepy to latch. Skin to skin with mom. Reviewed feeding cues and encouraged to feed whenever she sees them. Discussed normal newborn behavior the first 24 hours and cluster feeding. BF brochure given with resources for support after DC. To call for assist prn. No questions at present.   Patient Name: Mckenzie Mccoy UJWJX'BToday's Date: 06/22/2014 Reason for consult: Initial assessment   Maternal Data Formula Feeding for Exclusion: Yes Reason for exclusion: Mother's choice to formula and breast feed on admission Does the patient have breastfeeding experience prior to this delivery?: No  Feeding Feeding Type: Breast Fed Length of feed: 0 min (attempt)  LATCH Score/Interventions Latch: Too sleepy or reluctant, no latch achieved, no sucking elicited.  Audible Swallowing: None  Type of Nipple: Everted at rest and after stimulation  Comfort (Breast/Nipple): Soft / non-tender     Hold (Positioning): Assistance needed to correctly position infant at breast and maintain latch. Intervention(s): Breastfeeding basics reviewed;Support Pillows;Position options;Skin to skin  LATCH Score: 5  Lactation Tools Discussed/Used     Consult Status Consult Status: Follow-up Date: 06/23/14 Follow-up type: In-patient    Pamelia HoitWeeks, Keyontay Stolz D 06/22/2014, 2:55 PM

## 2014-06-22 NOTE — Progress Notes (Signed)
UR completed 

## 2014-06-22 NOTE — H&P (Signed)
  Mckenzie DossShanteria N Mccoy is a 23 y.o. female G1P0 at 5666w1d gestation presenting for contractions.  Maternal Medical History:  Reason for admission: Nausea.   Patient began having contractions at midnight last night. She was checked in the MAU and found to be 2 cm. She returned when contractions became more severe and frequent. Now every 5 minutes and she cannot tolerate them any longer. No leakage of fluid, no vaginal discharge. Some very minimal spotting. No headaches. Patient is GBS negative. No complications during this pregnancy. Patient desires epidural.  OB History    Grav  Para  Term  Preterm  Abortions  TAB  SAB  Ect  Mult  Living    1          0      Past Medical History   Diagnosis  Date   .  Asthma    .  Anemia     Past Surgical History   Procedure  Laterality  Date   .  No past surgeries      Family History: family history is not on file.  Social History: reports that she quit smoking about 2 months ago. Her smoking use included Cigarettes. She smoked 0.25 packs per day. She does not have any smokeless tobacco history on file. She reports that she does not drink alcohol or use illicit drugs.  Prenatal Transfer Tool   Maternal Diabetes: No  Genetic Screening: Normal  Maternal Ultrasounds/Referrals: Normal  Fetal Ultrasounds or other Referrals: None  Maternal Substance Abuse: No  Significant Maternal Medications: None  Significant Maternal Lab Results: None  Other Comments: None  Review of Systems  Constitutional: Negative for fever and chills.  HENT: Negative for hearing loss.  Eyes: Negative for blurred vision and double vision.  Respiratory: Negative for cough and sputum production.  Cardiovascular: Negative for chest pain and palpitations.  Gastrointestinal: Positive for abdominal pain. Negative for heartburn, nausea, vomiting and diarrhea.  Genitourinary: Negative for dysuria and urgency.  Musculoskeletal: Negative for myalgias.  Neurological: Negative for dizziness,  tingling and headaches.   Dilation: 4  Effacement (%): 90  Station: -1  Exam by:: K.WIlson,RN  Last menstrual period 09/13/2013.  Exam  Physical Exam  Constitutional: She is oriented to person, place, and time. She appears well-developed and well-nourished.  23 year old female on hands and knees, appears very uncomfortable  Cardiovascular: Normal rate and regular rhythm.  Respiratory: Effort normal and breath sounds normal.  GI: Soft.  Gravid, non-tender  Genitourinary: Vagina normal.  Musculoskeletal: She exhibits edema. She exhibits no tenderness.  Neurological: She is alert and oriented to person, place, and time.  Skin: Skin is warm and dry.   Prenatal labs:  ABO, Rh: B/Positive/-- (04/01 0000)  Antibody: Negative (04/01 0000)  Rubella: Immune (04/01 0000)  RPR: Nonreactive (04/01 0000)  HBsAg: Negative (04/01 0000)  HIV: Non-reactive (04/01 0000)  GBS: Negative (06/29 0000)  Assessment/Plan:  23 year old G1P0 female in active labor. She has made progress since this morning and now very severe.  #Labor- Admit to L&D. Continuous fetal monitoring. CBC and RPR.  #Anesthesia- Fentanyl and order epidural  #GBS negative   Patient seen with Francene FindersKimberly Shaw   Stephens, Devin A  06/21/2014, 9:14 PM   I have seen and examined this patient and I agree with the above. Cam HaiSHAW, KIMBERLY CNM 12:33 AM 06/22/2014

## 2014-06-23 ENCOUNTER — Other Ambulatory Visit: Payer: Medicaid Other

## 2014-06-23 MED ORDER — IBUPROFEN 600 MG PO TABS: 600.0000 mg | ORAL_TABLET | Freq: Four times a day (QID) | ORAL | Status: DC

## 2014-06-23 NOTE — Discharge Summary (Signed)
Obstetric Discharge Summary Reason for Admission: onset of labor Prenatal Procedures: none Intrapartum Procedures: spontaneous vaginal delivery Postpartum Procedures: none Complications-Operative and Postpartum: none Hemoglobin  Date Value Ref Range Status  06/22/2014 9.5* 12.0 - 15.0 g/dL Final     REPEATED TO VERIFY     DELTA CHECK NOTED     HCT  Date Value Ref Range Status  06/22/2014 26.6* 36.0 - 46.0 % Final   23 year old G1P1001 who presented at 7442w1d gestation with contractions. Patient was found to be in active labor, admitted to Labor and Delivery and had good progression of labor over the next 4 hours. Healthy newborn female was delivered without complication. Bleeding was minimal. She had minimal postpartum pain or bleeding. Patient was breastfeeding and chose depo-provera for contraception after delivery. Physical exam and vitals were reassuring. Patient discharged home with followup in 4-6 weeks.  Physical Exam:  General: alert, cooperative and no distress Lochia: appropriate Uterine Fundus: firm Incision: healing well, no significant drainage, no significant erythema DVT Evaluation: No evidence of DVT seen on physical exam. Negative Homan's sign. No cords or calf tenderness.  Discharge Diagnoses: Term Pregnancy-delivered  Discharge Information: Date: 06/23/2014 Activity: pelvic rest Diet: routine Medications: Ibuprofen Condition: stable Instructions: Contact physician for fever, nausea, bleeding, pain Discharge to: home Follow-up Information   Follow up with Your prenatal provider. Schedule an appointment as soon as possible for a visit in 4 weeks.      Newborn Data: Live born female  Birth Weight: 6 lb 8.4 oz (2960 g) APGAR: 8, 9  Home with mother.  Patient seen with Lindaann SloughFrances Cresenzo-Dishmon  Stephens, Devin A 06/23/2014, 7:57 AM  I have seen and examined this patient and agree the above assessment. CRESENZO-DISHMAN,Mehr Depaoli 06/23/2014 5:19 PM

## 2014-06-23 NOTE — Lactation Note (Signed)
This note was copied from the chart of Mckenzie Mccoy Sermeno. Lactation Consultation Note  Patient Name: Mckenzie Mccoy Rueckert ZOXWR'UToday's Date: 06/23/2014 Reason for consult: Follow-up assessment Mom has decided to pump and bottle feed. She declined LC assist with latch. LC reviewed with Mom the importance of pumping every 3 hours for 15-20 minutes to encourage milk production, prevent engorgement and protect milk supply. Engorgement care reviewed if needed. Storage guidelines reviewed. Mom has DEBP for home use. Advised of OP services and support group.   Maternal Data    Feeding Feeding Type: Bottle Fed - Formula  LATCH Score/Interventions                      Lactation Tools Discussed/Used     Consult Status Consult Status: Complete Date: 06/23/14 Follow-up type: In-patient    Alfred LevinsGranger, Gal Smolinski Ann 06/23/2014, 12:14 PM

## 2014-06-23 NOTE — Discharge Instructions (Signed)

## 2014-06-25 NOTE — Discharge Summary (Signed)
Attestation of Attending Supervision of Advanced Practitioner (CNM/NP): Evaluation and management procedures were performed by the Advanced Practitioner under my supervision and collaboration.  I have reviewed the Advanced Practitioner's note and chart, and I agree with the management and plan.  Tekia Waterbury 06/25/2014 9:16 AM   

## 2014-06-27 NOTE — MAU Provider Note (Signed)
Attestation of Attending Supervision of Advanced Practitioner: Evaluation and management procedures were performed by the PA/NP/CNM/OB Fellow under my supervision/collaboration. Chart reviewed and agree with management and plan.  Jarett Dralle V 06/27/2014 7:28 AM   

## 2014-06-27 NOTE — H&P (Signed)
Attestation of Attending Supervision of Advanced Practitioner: Evaluation and management procedures were performed by the PA/NP/CNM/OB Fellow under my supervision/collaboration. Chart reviewed and agree with management and plan.  Ciana Simmon V 06/27/2014 7:27 AM   

## 2014-08-04 ENCOUNTER — Ambulatory Visit (INDEPENDENT_AMBULATORY_CARE_PROVIDER_SITE_OTHER): Payer: Medicaid Other | Admitting: Obstetrics & Gynecology

## 2014-08-04 ENCOUNTER — Encounter: Payer: Self-pay | Admitting: Obstetrics & Gynecology

## 2014-08-04 DIAGNOSIS — Z23 Encounter for immunization: Secondary | ICD-10-CM

## 2014-08-04 DIAGNOSIS — Z3042 Encounter for surveillance of injectable contraceptive: Secondary | ICD-10-CM

## 2014-08-04 DIAGNOSIS — Z3049 Encounter for surveillance of other contraceptives: Secondary | ICD-10-CM

## 2014-08-04 MED ORDER — MEDROXYPROGESTERONE ACETATE 150 MG/ML IM SUSP
150.0000 mg | Freq: Once | INTRAMUSCULAR | Status: AC
  Administered 2014-08-04: 150 mg via INTRAMUSCULAR

## 2014-08-04 NOTE — Progress Notes (Signed)
    Subjective:     Mckenzie Mccoy is a 23 y.o. G42P1001 female who presents for a postpartum visit. She is 6 weeks postpartum following a spontaneous vaginal delivery. I have fully reviewed the prenatal and intrapartum course. The delivery was at 40 gestational weeks.  Anesthesia: epidural. Postpartum course has been uncomplicated. Baby's course has been uncomplicated. Baby is feeding by bottle. Currently on menstrual period.  Bowel function is normal. Bladder function is normal. Patient is not sexually active. Contraception method is Depo-Provera injections. Postpartum depression screening: negative.  The following portions of the patient's history were reviewed and updated as appropriate: allergies, current medications, past family history, past medical history, past social history, past surgical history and problem list.  Normal pap in 01/2014 at [facility] (previous OB provider).  Review of Systems Pertinent items are noted in HPI.   Objective:    BP 139/81  Pulse 48  Ht  (1.778 m)  Wt 183 lb (83.008 kg)  BMI 26.26 kg/m2  LMP 08/02/2014  Breastfeeding? No  General:  alert and no distress   Breasts:  inspection negative, no nipple discharge or bleeding, no masses or nodularity palpable  Abdomen: soft, non-tender; bowel sounds normal; no masses,  no organomegaly   Vulva:  normal  Vagina: normal vagina  Cervix:  multiparous appearance  Corpus: normal size, contour, position, consistency, mobility, non-tender  Adnexa:  normal adnexa  Rectal Exam: Not performed.        Assessment:   Normal; postpartum exam. Pap smear not done at today's visit.   Plan:   1. Contraception: Depo-Provera injections 2.  Flu vaccine to be given today 3.  Follow up as needed.   Jaynie Collins, MD, FACOG Attending Obstetrician & Gynecologist Center for [employer], [facility]

## 2014-09-25 ENCOUNTER — Encounter: Payer: Self-pay | Admitting: Obstetrics & Gynecology

## 2014-11-02 ENCOUNTER — Ambulatory Visit (INDEPENDENT_AMBULATORY_CARE_PROVIDER_SITE_OTHER): Payer: Medicaid Other | Admitting: *Deleted

## 2014-11-02 DIAGNOSIS — Z23 Encounter for immunization: Secondary | ICD-10-CM

## 2014-11-02 DIAGNOSIS — Z3042 Encounter for surveillance of injectable contraceptive: Secondary | ICD-10-CM

## 2014-11-02 MED ORDER — MEDROXYPROGESTERONE ACETATE 150 MG/ML IM SUSP
150.0000 mg | INTRAMUSCULAR | Status: AC
  Administered 2014-11-02 – 2016-01-14 (×4): 150 mg via INTRAMUSCULAR

## 2014-11-03 ENCOUNTER — Ambulatory Visit: Payer: Medicaid Other

## 2015-01-29 ENCOUNTER — Ambulatory Visit: Payer: Medicaid Other

## 2015-01-31 ENCOUNTER — Ambulatory Visit (INDEPENDENT_AMBULATORY_CARE_PROVIDER_SITE_OTHER): Payer: Medicaid Other | Admitting: *Deleted

## 2015-01-31 DIAGNOSIS — Z3042 Encounter for surveillance of injectable contraceptive: Secondary | ICD-10-CM

## 2015-01-31 NOTE — Progress Notes (Signed)
This is patients third injection of Depo Provera post delivery and she is continuing to have bleeding just a small amount every day.  She wishes to receive another injection and if her bleeding continues she will call [place] to discuss other options for birth control.

## 2015-04-26 ENCOUNTER — Ambulatory Visit (INDEPENDENT_AMBULATORY_CARE_PROVIDER_SITE_OTHER): Payer: Medicaid Other | Admitting: *Deleted

## 2015-04-26 DIAGNOSIS — Z3042 Encounter for surveillance of injectable contraceptive: Secondary | ICD-10-CM

## 2015-04-26 MED ORDER — MEDROXYPROGESTERONE ACETATE 150 MG/ML IM SUSP
150.0000 mg | Freq: Once | INTRAMUSCULAR | Status: AC
  Administered 2015-04-26: 150 mg via INTRAMUSCULAR

## 2015-04-26 NOTE — Progress Notes (Signed)
Pt is here today for her Depo Provera.

## 2015-04-28 ENCOUNTER — Encounter (HOSPITAL_COMMUNITY): Payer: Self-pay | Admitting: Emergency Medicine

## 2015-04-28 ENCOUNTER — Emergency Department (INDEPENDENT_AMBULATORY_CARE_PROVIDER_SITE_OTHER)
Admission: EM | Admit: 2015-04-28 | Discharge: 2015-04-28 | Disposition: A | Discharge status: Home / Self Care | Payer: Self-pay | Source: Home / Self Care | Attending: Family Medicine | Admitting: Family Medicine

## 2015-04-28 DIAGNOSIS — K529 Noninfective gastroenteritis and colitis, unspecified: Secondary | ICD-10-CM

## 2015-04-28 MED ORDER — ONDANSETRON 4 MG PO TBDP
ORAL_TABLET | ORAL | Status: AC
  Filled 2015-04-28: qty 2

## 2015-04-28 MED ORDER — PROMETHAZINE HCL 25 MG PO TABS: 25.0000 mg | ORAL_TABLET | Freq: Four times a day (QID) | ORAL | Status: DC | PRN

## 2015-04-28 MED ORDER — ONDANSETRON 4 MG PO TBDP
8.0000 mg | ORAL_TABLET | Freq: Once | ORAL | Status: AC
  Administered 2015-04-28: 8 mg via ORAL

## 2015-04-28 NOTE — Discharge Instructions (Signed)
Thank you for coming in today. If your belly pain worsens, or you have high fever, bad vomiting, blood in your stool or black tarry stool go to the Emergency Room.   Viral Gastroenteritis Viral gastroenteritis is also known as stomach flu. This condition affects the stomach and intestinal tract. It can cause sudden diarrhea and vomiting. The illness typically lasts 3 to 8 days. Most people develop an immune response that eventually gets rid of the virus. While this natural response develops, the virus can make you quite ill. CAUSES  Many different viruses can cause gastroenteritis, such as rotavirus or noroviruses. You can catch one of these viruses by consuming contaminated food or water. You may also catch a virus by sharing utensils or other personal items with an infected person or by touching a contaminated surface. SYMPTOMS  The most common symptoms are diarrhea and vomiting. These problems can cause a severe loss of body fluids (dehydration) and a body salt (electrolyte) imbalance. Other symptoms may include:  Fever.  Headache.  Fatigue.  Abdominal pain. DIAGNOSIS  Your caregiver can usually diagnose viral gastroenteritis based on your symptoms and a physical exam. A stool sample may also be taken to test for the presence of viruses or other infections. TREATMENT  This illness typically goes away on its own. Treatments are aimed at rehydration. The most serious cases of viral gastroenteritis involve vomiting so severely that you are not able to keep fluids down. In these cases, fluids must be given through an intravenous line (IV). HOME CARE INSTRUCTIONS   Drink enough fluids to keep your urine clear or pale yellow. Drink small amounts of fluids frequently and increase the amounts as tolerated.  Ask your caregiver for specific rehydration instructions.  Avoid:  Foods high in sugar.  Alcohol.  Carbonated drinks.  Tobacco.  Juice.  Caffeine drinks.  Extremely hot or cold  fluids.  Fatty, greasy foods.  Too much intake of anything at one time.  Dairy products until 24 to 48 hours after diarrhea stops.  You may consume probiotics. Probiotics are active cultures of beneficial bacteria. They may lessen the amount and number of diarrheal stools in adults. Probiotics can be found in yogurt with active cultures and in supplements.  Wash your hands well to avoid spreading the virus.  Only take over-the-counter or prescription medicines for pain, discomfort, or fever as directed by your caregiver. Do not give aspirin to children. Antidiarrheal medicines are not recommended.  Ask your caregiver if you should continue to take your regular prescribed and over-the-counter medicines.  Keep all follow-up appointments as directed by your caregiver. SEEK IMMEDIATE MEDICAL CARE IF:   You are unable to keep fluids down.  You do not urinate at least once every 6 to 8 hours.  You develop shortness of breath.  You notice blood in your stool or vomit. This may look like coffee grounds.  You have abdominal pain that increases or is concentrated in one small area (localized).  You have persistent vomiting or diarrhea.  You have a fever.  The patient is a child younger than 3 months, and he or she has a fever.  The patient is a child older than 3 months, and he or she has a fever and persistent symptoms.  The patient is a child older than 3 months, and he or she has a fever and symptoms suddenly get worse.  The patient is a baby, and he or she has no tears when crying. MAKE SURE YOU:     Understand these instructions.  Will watch your condition.  Will get help right away if you are not doing well or get worse. Document Released: 11/10/2005 Document Revised: 02/02/2012 Document Reviewed: 08/27/2011 ExitCare Patient Information 2015 [employer]. This information is not intended to replace advice given to you by your health care provider. Make sure you discuss  any questions you have with your health care provider.  

## 2015-04-28 NOTE — ED Notes (Signed)
Reports of poss stomach virus onset 2 days Sx include n/v/d Alert, no signs of acute distress.

## 2015-04-28 NOTE — ED Provider Notes (Signed)
Mckenzie Mccoy is a 24 y.o. female who presents to Urgent Care today for vomiting and diarrhea present for 2 days. Patient notes abdominal cramping prior to her diarrhea. No fevers or chills urinary symptoms. She works in Primary school teacher. She vomited today. No blood in the vomit or stool. No treatment tried yet.   Past Medical History  Diagnosis Date  . Asthma   . Anemia    Past Surgical History  Procedure Laterality Date  . No past surgeries     History  Substance Use Topics  . Smoking status: Current Every Day Smoker -- 0.25 packs/day    Types: Cigarettes  . Smokeless tobacco: Never Used  . Alcohol Use: No   ROS as above Medications: Current Facility-Administered Medications  Medication Dose Route Frequency Provider Last Rate Last Dose  . medroxyPROGESTERone (DEPO-PROVERA) injection 150 mg  150 mg Intramuscular Q90 days Mora Bellman, MD   150 mg at 01/31/15 1435  . ondansetron (ZOFRAN-ODT) disintegrating tablet 8 mg  8 mg Oral Once Gregor Hams, MD       Current Outpatient Prescriptions  Medication Sig Dispense Refill  . Prenatal Vit-Fe Fumarate-FA (PRENATAL MULTIVITAMIN) TABS tablet Take 1 tablet by mouth daily at 12 noon.     Allergies  Allergen Reactions  . Penicillins Shortness Of Breath     Exam:  BP 134/74 mmHg  Pulse 53  Temp(Src) 98.9 F (37.2 C) (Oral)  Resp 12  SpO2 100%  Breastfeeding? No Gen: Well NAD HEENT: EOMI,  MMM Lungs: Normal work of breathing. CTABL Heart: RRR no MRG Abd: NABS, Soft. Nondistended, Nontender Exts: Brisk capillary refill, warm and well perfused.   Patient was given 8 mg Zofran ODT  No results found for this or any previous visit (from the past 24 hour(s)). No results found.  Assessment and Plan: 24 y.o. female with vomiting and diarrhea likely viral gastroenteritis however bacterial is a may be a possibility. Patient was sent home with a collection kit for a stool culture and C. difficile. She'll bring the tests back. Treat  empirically with Phenergan. Return as needed.  Discussed warning signs or symptoms. Please see discharge instructions. Patient expresses understanding.     Gregor Hams, MD 04/28/15 [phone]

## 2015-07-26 ENCOUNTER — Ambulatory Visit: Payer: Medicaid Other

## 2015-11-05 ENCOUNTER — Ambulatory Visit (INDEPENDENT_AMBULATORY_CARE_PROVIDER_SITE_OTHER): Payer: Medicaid Other | Admitting: *Deleted

## 2015-11-05 DIAGNOSIS — Z30013 Encounter for initial prescription of injectable contraceptive: Secondary | ICD-10-CM

## 2015-11-05 DIAGNOSIS — Z3202 Encounter for pregnancy test, result negative: Secondary | ICD-10-CM

## 2015-11-05 DIAGNOSIS — Z3042 Encounter for surveillance of injectable contraceptive: Secondary | ICD-10-CM

## 2015-11-05 LAB — POCT URINE PREGNANCY: Preg Test, Ur: NEGATIVE

## 2015-11-05 NOTE — Progress Notes (Signed)
Pt here for Depo injection, last shot given 04-26-2015.  Pt declines unprotected intercourse in the past 2 weeks.  UPT negative. Depo given, pt to schedule physical appt when next injection is due.

## 2016-01-14 ENCOUNTER — Ambulatory Visit (INDEPENDENT_AMBULATORY_CARE_PROVIDER_SITE_OTHER): Payer: Medicaid Other | Admitting: *Deleted

## 2016-01-14 DIAGNOSIS — Z3042 Encounter for surveillance of injectable contraceptive: Secondary | ICD-10-CM

## 2016-01-14 NOTE — Progress Notes (Signed)
Pt here today for Depo Provera 150mg.  

## 2016-01-21 ENCOUNTER — Ambulatory Visit: Payer: Medicaid Other

## 2016-01-21 ENCOUNTER — Ambulatory Visit: Payer: Medicaid Other | Admitting: Obstetrics and Gynecology

## 2016-03-31 ENCOUNTER — Ambulatory Visit: Payer: Medicaid Other | Admitting: Family Medicine

## 2020-08-13 ENCOUNTER — Emergency Department (HOSPITAL_COMMUNITY): Payer: Medicaid Other

## 2020-08-13 ENCOUNTER — Encounter (HOSPITAL_COMMUNITY): Payer: Self-pay | Admitting: Emergency Medicine

## 2020-08-13 ENCOUNTER — Other Ambulatory Visit: Payer: Self-pay

## 2020-08-13 ENCOUNTER — Emergency Department (HOSPITAL_COMMUNITY)
Admission: EM | Admit: 2020-08-13 | Discharge: 2020-08-13 | Disposition: A | Discharge status: Home / Self Care | Payer: Medicaid Other | Attending: Emergency Medicine | Admitting: Emergency Medicine

## 2020-08-13 DIAGNOSIS — R509 Fever, unspecified: Secondary | ICD-10-CM | POA: Diagnosis present

## 2020-08-13 DIAGNOSIS — Z20822 Contact with and (suspected) exposure to covid-19: Secondary | ICD-10-CM | POA: Insufficient documentation

## 2020-08-13 DIAGNOSIS — N3091 Cystitis, unspecified with hematuria: Secondary | ICD-10-CM | POA: Diagnosis not present

## 2020-08-13 DIAGNOSIS — J45909 Unspecified asthma, uncomplicated: Secondary | ICD-10-CM | POA: Diagnosis not present

## 2020-08-13 DIAGNOSIS — R197 Diarrhea, unspecified: Secondary | ICD-10-CM | POA: Insufficient documentation

## 2020-08-13 DIAGNOSIS — F17210 Nicotine dependence, cigarettes, uncomplicated: Secondary | ICD-10-CM | POA: Diagnosis not present

## 2020-08-13 DIAGNOSIS — M791 Myalgia, unspecified site: Secondary | ICD-10-CM | POA: Insufficient documentation

## 2020-08-13 DIAGNOSIS — R519 Headache, unspecified: Secondary | ICD-10-CM | POA: Insufficient documentation

## 2020-08-13 DIAGNOSIS — M545 Low back pain: Secondary | ICD-10-CM | POA: Insufficient documentation

## 2020-08-13 DIAGNOSIS — N3001 Acute cystitis with hematuria: Secondary | ICD-10-CM

## 2020-08-13 LAB — COMPREHENSIVE METABOLIC PANEL
ALT: 14 U/L (ref 0–44)
AST: 16 U/L (ref 15–41)
Albumin: 4 g/dL (ref 3.5–5.0)
Alkaline Phosphatase: 38 U/L (ref 38–126)
Anion gap: 11 (ref 5–15)
BUN: 5 mg/dL — ABNORMAL LOW (ref 6–20)
CO2: 26 mmol/L (ref 22–32)
Calcium: 9.8 mg/dL (ref 8.9–10.3)
Chloride: 99 mmol/L (ref 98–111)
Creatinine, Ser: 1.04 mg/dL — ABNORMAL HIGH (ref 0.44–1.00)
GFR calc Af Amer: >60 mL/min (ref >60)
GFR calc non Af Amer: >60 mL/min (ref >60)
Glucose, Bld: 103 mg/dL — ABNORMAL HIGH (ref 70–99)
Potassium: 4.5 mmol/L (ref 3.5–5.1)
Sodium: 136 mmol/L (ref 135–145)
Total Bilirubin: 1.2 mg/dL (ref 0.3–1.2)
Total Protein: 7.4 g/dL (ref 6.5–8.1)

## 2020-08-13 LAB — URINALYSIS, ROUTINE W REFLEX MICROSCOPIC
Bilirubin Urine: NEGATIVE
Glucose, UA: NEGATIVE mg/dL
Ketones, ur: NEGATIVE mg/dL
Nitrite: NEGATIVE
Protein, ur: 100 mg/dL — AB
Specific Gravity, Urine: 1.015 (ref 1.005–1.030)
WBC, UA: >50 WBC/hpf — ABNORMAL HIGH (ref 0–5)
pH: 5 (ref 5.0–8.0)

## 2020-08-13 LAB — CBC WITH DIFFERENTIAL/PLATELET
Abs Immature Granulocytes: 0.03 10*3/uL (ref 0.00–0.07)
Basophils Absolute: 0 10*3/uL (ref 0.0–0.1)
Basophils Relative: 0 %
Eosinophils Absolute: 0 10*3/uL (ref 0.0–0.5)
Eosinophils Relative: 0 %
HCT: 40.6 % (ref 36.0–46.0)
Hemoglobin: 13.8 g/dL (ref 12.0–15.0)
Immature Granulocytes: 0 %
Lymphocytes Relative: 9 %
Lymphs Abs: 0.9 10*3/uL (ref 0.7–4.0)
MCH: 32.1 pg (ref 26.0–34.0)
MCHC: 34 g/dL (ref 30.0–36.0)
MCV: 94.4 fL (ref 80.0–100.0)
Monocytes Absolute: 0.8 10*3/uL (ref 0.1–1.0)
Monocytes Relative: 7 %
Neutro Abs: 8.4 10*3/uL — ABNORMAL HIGH (ref 1.7–7.7)
Neutrophils Relative %: 84 %
Platelets: 214 10*3/uL (ref 150–400)
RBC: 4.3 MIL/uL (ref 3.87–5.11)
RDW: 11.1 % — ABNORMAL LOW (ref 11.5–15.5)
WBC: 10.2 10*3/uL (ref 4.0–10.5)
nRBC: 0 % (ref 0.0–0.2)

## 2020-08-13 LAB — SARS CORONAVIRUS 2 BY RT PCR (HOSPITAL ORDER, PERFORMED IN ~~LOC~~ HOSPITAL LAB): SARS Coronavirus 2: NEGATIVE

## 2020-08-13 LAB — POC URINE PREG, ED: Preg Test, Ur: NEGATIVE

## 2020-08-13 MED ORDER — CIPROFLOXACIN HCL 250 MG PO TABS: 250.0000 mg | ORAL_TABLET | Freq: Two times a day (BID) | ORAL | 0 refills | Status: AC

## 2020-08-13 MED ORDER — IBUPROFEN 400 MG PO TABS
600.0000 mg | ORAL_TABLET | Freq: Once | ORAL | Status: AC
  Administered 2020-08-13: 600 mg via ORAL
  Filled 2020-08-13: qty 1

## 2020-08-13 MED ORDER — ACETAMINOPHEN 500 MG PO TABS
1000.0000 mg | ORAL_TABLET | Freq: Once | ORAL | Status: AC
  Administered 2020-08-13: 1000 mg via ORAL
  Filled 2020-08-13: qty 2

## 2020-08-13 MED ORDER — CIPROFLOXACIN HCL 500 MG PO TABS
500.0000 mg | ORAL_TABLET | Freq: Once | ORAL | Status: AC
  Administered 2020-08-13: 500 mg via ORAL
  Filled 2020-08-13: qty 1

## 2020-08-13 NOTE — ED Provider Notes (Signed)
[facility] EMERGENCY DEPARTMENT Provider Note   CSN: [id number] Arrival date & time: 08/13/20  7253     History Chief Complaint  Patient presents with  . Fever    Mckenzie Mccoy is a 29 y.o. female.  29 year old female with history of asthma as a child, current daily smoker presents with complaint of fever with chills, sweats and loose stools as well as body aches.  Symptoms started 3 days ago.  Patient has not been vaccinated against COVID-19.  Patient denies respiratory symptoms, states that she was exposed to her cousin who has been sick however did not get tested for Covid and improved with "home remedies."  No other complaints or concerns today.  Mckenzie Mccoy was evaluated in Emergency Department on 08/13/2020 for the symptoms described in the history of present illness. She was evaluated in the context of the global COVID-19 pandemic, which necessitated consideration that the patient might be at risk for infection with the SARS-CoV-2 virus that causes COVID-19. Institutional protocols and algorithms that pertain to the evaluation of patients at risk for COVID-19 are in a state of rapid change based on information released by regulatory bodies including the CDC and federal and state organizations. These policies and algorithms were followed during the patient's care in the ED.         Past Medical History:  Diagnosis Date  . Anemia   . Asthma     Patient Active Problem List   Diagnosis Date Noted  . Hemoglobin S (Hb-S) trait (HCC) 06/05/2014    Past Surgical History:  Procedure Laterality Date  . NO PAST SURGERIES       OB History    Gravida  1   Para  1   Term  1   Preterm      AB      Living  1     SAB      TAB      Ectopic      Multiple      Live Births  1           History reviewed. No pertinent family history.  Social History   Tobacco Use  . Smoking status: Current Every Day Smoker    Packs/day: 0.25     Types: Cigarettes  . Smokeless tobacco: Never Used  Substance Use Topics  . Alcohol use: No  . Drug use: No    Home Medications Prior to Admission medications   Medication Sig Start Date End Date Taking? Authorizing Provider  ciprofloxacin (CIPRO) 250 MG tablet Take 1 tablet (250 mg total) by mouth every 12 (twelve) hours for 5 days. 08/13/20 08/18/20  Jeannie Fend, PA-C    Allergies    Penicillins  Review of Systems   Review of Systems  Constitutional: Positive for chills, diaphoresis and fever.  HENT: Negative for congestion and sore throat.   Respiratory: Negative for cough, shortness of breath and wheezing.   Gastrointestinal: Positive for diarrhea. Negative for abdominal pain, blood in stool, nausea and vomiting.  Genitourinary: Negative for dysuria, frequency and vaginal discharge.  Musculoskeletal: Positive for arthralgias, back pain and myalgias.  Skin: Negative for rash and wound.  Allergic/Immunologic: Negative for immunocompromised state.  Neurological: Positive for headaches. Negative for weakness.  Hematological: Negative for adenopathy.  All other systems reviewed and are negative.   Physical Exam Updated Vital Signs BP (!) 142/79 (BP Location: Right Arm)   Pulse 85   Temp (!) 101.3 F (  38.5 C) (Oral)   Resp 18   SpO2 100%   Physical Exam Vitals and nursing note reviewed.  Constitutional:      General: She is not in acute distress.    Appearance: She is well-developed. She is not diaphoretic.  HENT:     Head: Normocephalic and atraumatic.  Eyes:     Conjunctiva/sclera: Conjunctivae normal.  Cardiovascular:     Rate and Rhythm: Normal rate and regular rhythm.     Pulses: Normal pulses.     Heart sounds: Normal heart sounds.  Pulmonary:     Effort: Pulmonary effort is normal.     Breath sounds: Normal breath sounds.  Abdominal:     Palpations: Abdomen is soft.     Tenderness: There is no abdominal tenderness. There is no right CVA tenderness or  left CVA tenderness.  Musculoskeletal:     Cervical back: Neck supple.     Right lower leg: No edema.     Left lower leg: No edema.  Skin:    General: Skin is warm and dry.     Findings: No erythema or rash.  Neurological:     Mental Status: She is alert and oriented to person, place, and time.  Psychiatric:        Behavior: Behavior normal.     ED Results / Procedures / Treatments   Labs (all labs ordered are listed, but only abnormal results are displayed) Labs Reviewed  URINALYSIS, ROUTINE W REFLEX MICROSCOPIC - Abnormal; Notable for the following components:      Result Value   Color, Urine AMBER (*)    APPearance TURBID (*)    Hgb urine dipstick MODERATE (*)    Protein, ur 100 (*)    Leukocytes,Ua LARGE (*)    WBC, UA >50 (*)    Bacteria, UA FEW (*)    Non Squamous Epithelial 0-5 (*)    All other components within normal limits  COMPREHENSIVE METABOLIC PANEL - Abnormal; Notable for the following components:   Glucose, Bld 103 (*)    BUN 5 (*)    Creatinine, Ser 1.04 (*)    All other components within normal limits  CBC WITH DIFFERENTIAL/PLATELET - Abnormal; Notable for the following components:   RDW 11.1 (*)    Neutro Abs 8.4 (*)    All other components within normal limits  SARS CORONAVIRUS 2 BY RT PCR (HOSPITAL ORDER, PERFORMED IN [facility])  URINE CULTURE  POC URINE PREG, ED    EKG None  Radiology DG Chest Port 1 View  Result Date: 08/13/2020 CLINICAL DATA:  Fever EXAM: PORTABLE CHEST 1 VIEW COMPARISON:  None. FINDINGS: The lungs are clear. The heart size and pulmonary vascularity are normal. No adenopathy. No bone lesions. IMPRESSION: Lungs clear.  Cardiac silhouette within normal limits. Electronically Signed   By: Bretta Bang III M.D.   On: 08/13/2020 14:30   CT Renal Stone Study  Result Date: 08/13/2020 CLINICAL DATA:  Flank pain, kidney stone suspected EXAM: CT ABDOMEN AND PELVIS WITHOUT CONTRAST TECHNIQUE: Multidetector CT  imaging of the abdomen and pelvis was performed following the standard protocol without IV contrast. COMPARISON:  None. FINDINGS: Lower chest: Pulmonary micronodule within the right lower and left lower lobes ( 3:2). No acute abnormality. Hepatobiliary: No focal liver abnormality is seen. No gallstones, gallbladder wall thickening, or biliary dilatation. Pancreas: Unremarkable. No pancreatic ductal dilatation or surrounding inflammatory changes. Spleen: Normal in size without focal abnormality. Adrenals/Urinary Tract: No adrenal nodule bilaterally.  No nephrolithiasis, no hydronephrosis, and no contour-deforming renal mass. No ureterolithiasis or hydroureter. The urinary bladder is unremarkable. Stomach/Bowel: Stomach is within normal limits. No evidence of bowel wall thickening, distention, or inflammatory changes. The cecum is slightly medialized. The appendix is not definitely identified. Few scattered sigmoid diverticula. Vascular/Lymphatic: A left phlebolith identified within the pelvis. No abdominal aorta or iliac aneurysm. No abdominal, pelvic, or inguinal lymphadenopathy. Reproductive: Uterus and bilateral ovaries and adnexal regions are unremarkable. Other: Trace simple free fluid within the pelvis. No intraperitoneal free gas. No organized fluid collection. Musculoskeletal: No abdominal wall hernia or abnormality No suspicious lytic or blastic osseous lesions. No acute displaced fracture. IMPRESSION: 1. No hydroureteronephrosis or nephroureterolithiasis bilaterally. 2. Nonspecific trace simple free fluid within the pelvis. The uterus and bilateral ovaries/adnexal regions are unremarkable. 3. Appendix not definitely identified in a patient with a medialized cecum. 4. Few scattered sigmoid diverticula with no acute diverticulitis. Electronically Signed   By: Tish Frederickson M.D.   On: 08/13/2020 16:49    Procedures Procedures (including critical care time)  Medications Ordered in ED Medications    ibuprofen (ADVIL) tablet 600 mg (600 mg Oral Given 08/13/20 0806)  acetaminophen (TYLENOL) tablet 1,000 mg (1,000 mg Oral Given 08/13/20 0807)  ciprofloxacin (CIPRO) tablet 500 mg (500 mg Oral Given 08/13/20 1552)  acetaminophen (TYLENOL) tablet 1,000 mg (1,000 mg Oral Given 08/13/20 1552)    ED Course  I have reviewed the triage vital signs and the nursing notes.  Pertinent labs & imaging results that were available during my care of the patient were reviewed by me and considered in my medical decision making (see chart for details).  Clinical Course as of Aug 13 2050  Mon Aug 13, 2020  3093 29 year old female with history as above.  Patient did have a temperature of 102.6 earlier this morning during extended wait, was given Tylenol and fever improved. On exam, patient is well-appearing.  Covid test is negative.  Discussed possibility of false negative test, will add CBC, CHEM, chest x-ray and urine for further evaluation.   [LM]  2049 Suspect viral illness despite negative COVID test today. Plan to cover possible UTI with Cipro (pcn allergy, concern for right side flank pain without CVA tenderness). CT added on for right flank pain, no acute findings, diverticulosis without diverticulitis. Discussed results and plan of care with patient.    [LM]    Clinical Course User Index [LM] Alden Hipp   MDM Rules/Calculators/A&P                          Final Clinical Impression(s) / ED Diagnoses Final diagnoses:  Fever, unspecified fever cause  Acute cystitis with hematuria  Diarrhea, unspecified type    Rx / DC Orders ED Discharge Orders         Ordered    ciprofloxacin (CIPRO) 250 MG tablet  Every 12 hours        08/13/20 1534           Alden Hipp 08/13/20 2051    Blane Ohara, MD 08/14/20 2320

## 2020-08-13 NOTE — Discharge Instructions (Signed)
Follow up with your primary care provider for recheck in 2 days. Return to ER for worsening or concerning symptoms. Take Cipro as prescribed and complete the full course. Take Motrin and Tylenol as needed as directed for fever.

## 2020-08-13 NOTE — ED Triage Notes (Signed)
Pt reports 3 days of fever and bodyaches. Denies recent cough, sore throat. Pt also reports flank. Denies pain or burning with urination. VSS. NAD.

## 2020-08-13 NOTE — ED Notes (Signed)
Patient transported to CT 

## 2020-08-15 LAB — URINE CULTURE: Culture: >=100000 — AB

## 2022-04-07 IMAGING — CT CT RENAL STONE PROTOCOL
2 of 4 series · 16 of 46 positions shown, 18 images · non-contrast
Comparison: None.

CLINICAL DATA: Flank pain, kidney stone suspected

EXAM:
CT ABDOMEN AND PELVIS WITHOUT CONTRAST
TECHNIQUE: Multidetector CT imaging of the abdomen and pelvis was performed
following the standard protocol without IV contrast.

[Series 3: ap without · axial · non-contrast · 0.69mm/px · z∈[+895,+1305]mm · 13 of 92 slices shown, 15 images]
[im 5/92  soft-tissue]
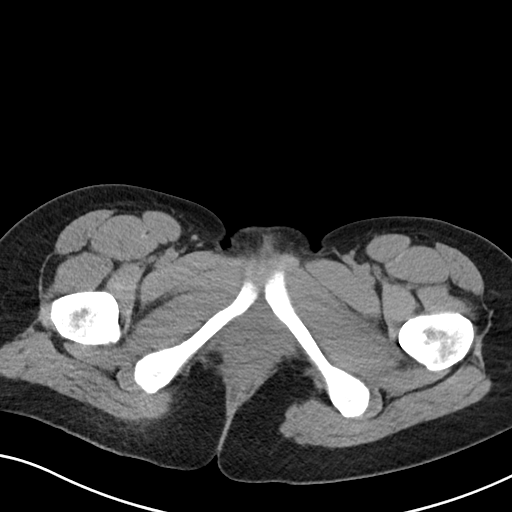
[im 5/92  bone]
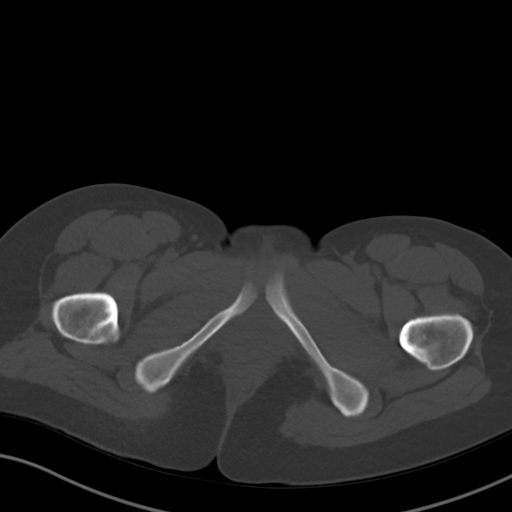
[im 14/92  soft-tissue]
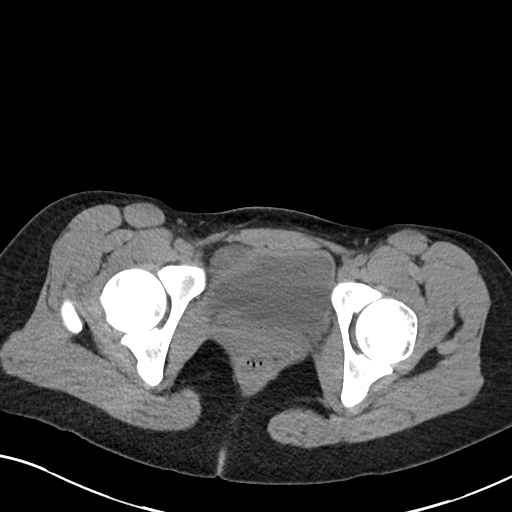
[im 19/92  soft-tissue]
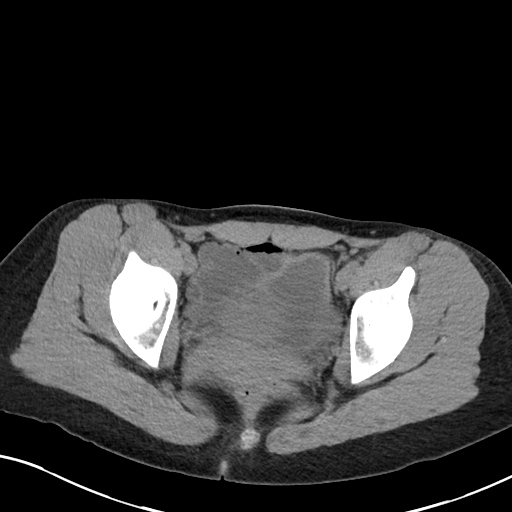
[im 28/92  soft-tissue]
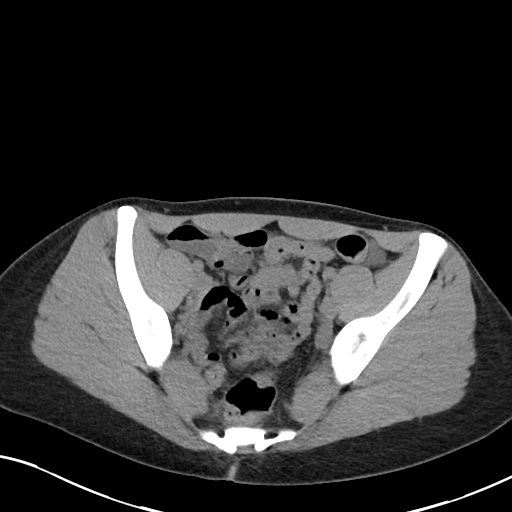
[im 32/92  soft-tissue]
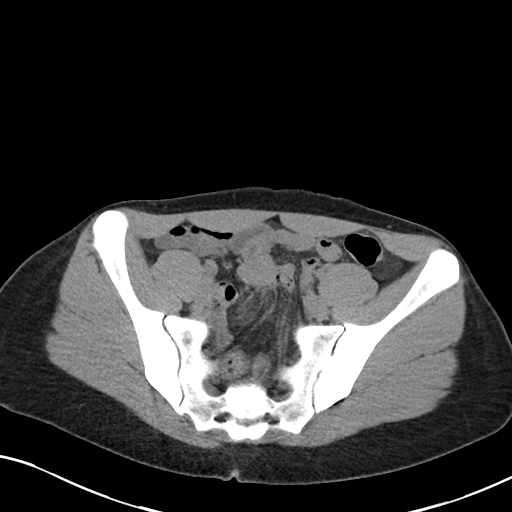
[im 41/92  soft-tissue]
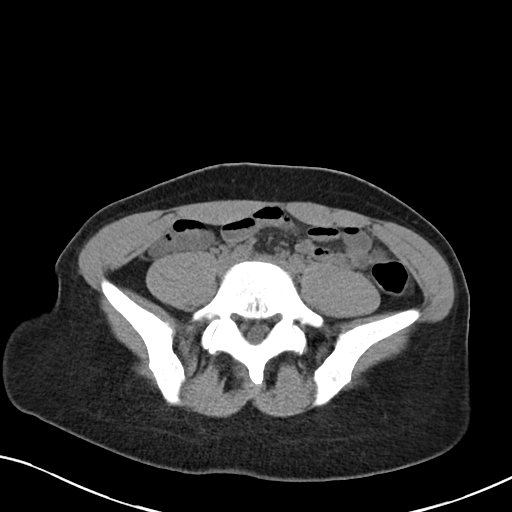
[im 46/92  soft-tissue]
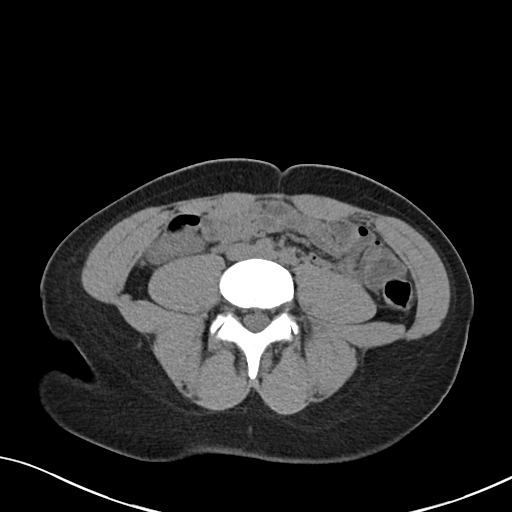
[im 51/92  soft-tissue]
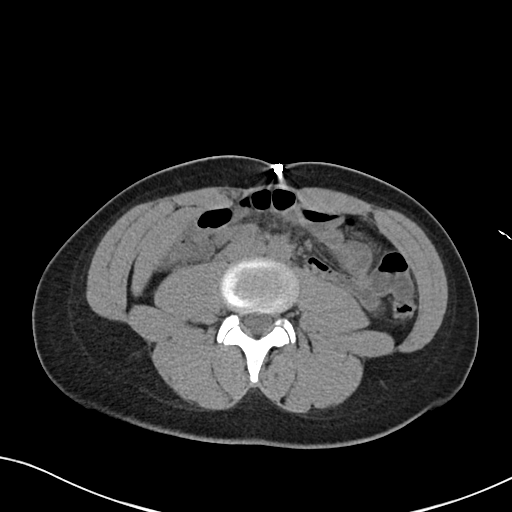
[im 60/92  soft-tissue]
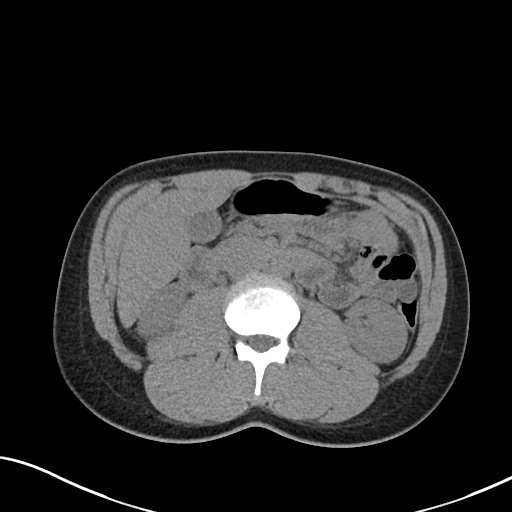
[im 60/92  bone]
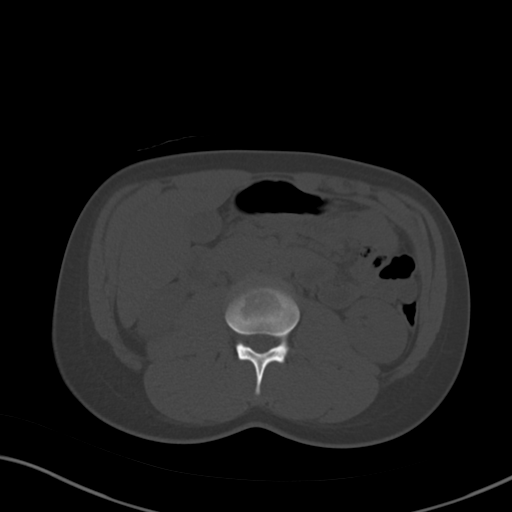
[im 64/92  soft-tissue]
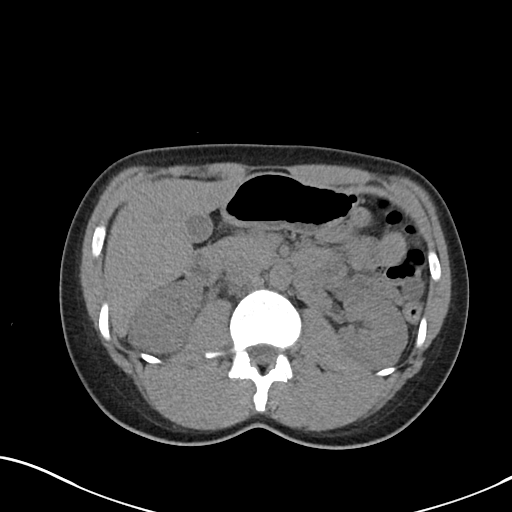
[im 73/92  soft-tissue]
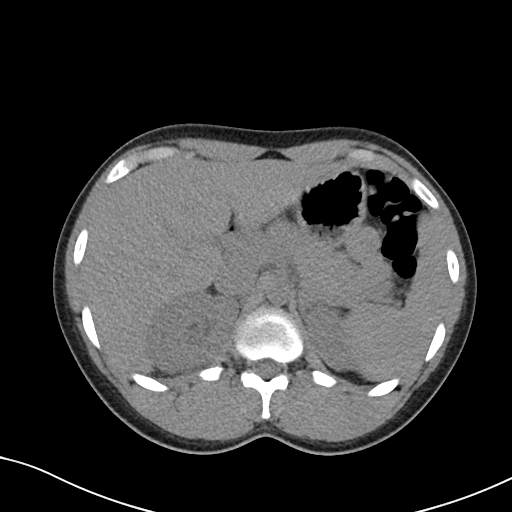
[im 78/92  soft-tissue]
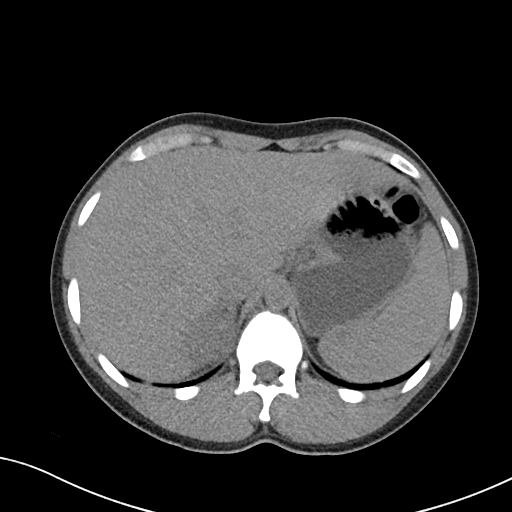
[im 87/92  soft-tissue]
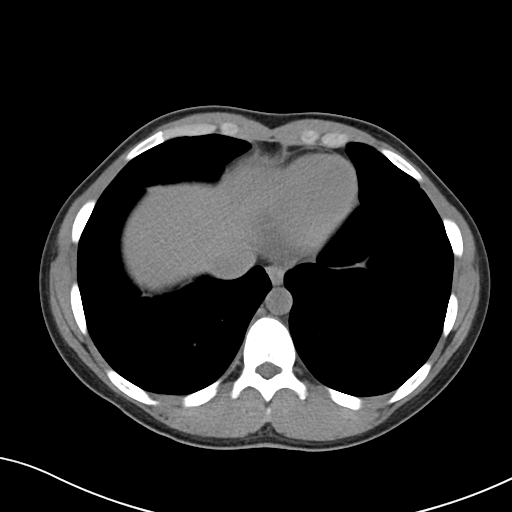

[Series 6: cor · coronal · 0.84mm/px · 3 of 88 slices shown]
[im 30/88  soft-tissue]
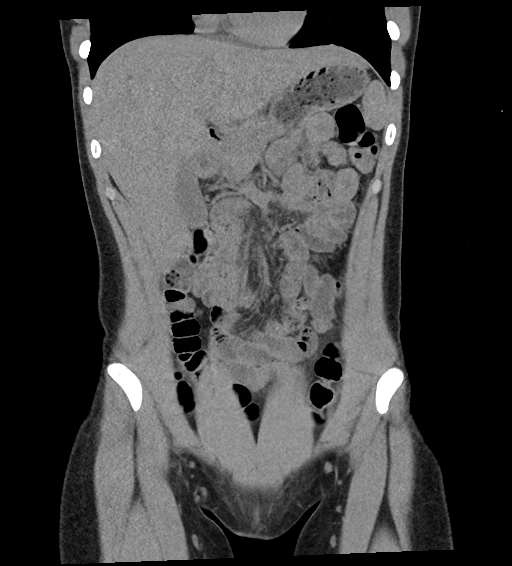
[im 39/88  soft-tissue]
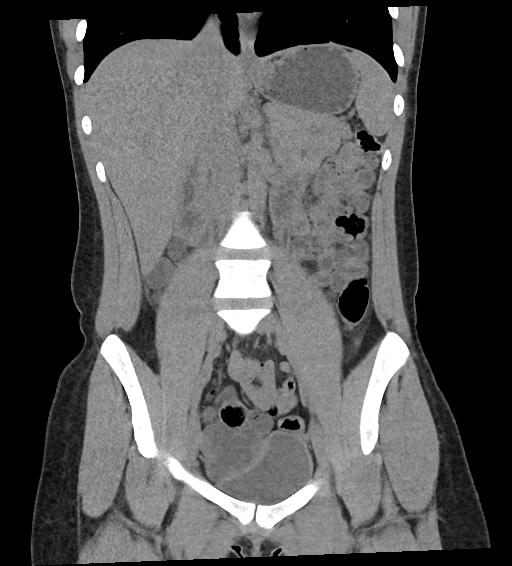
[im 49/88  soft-tissue]
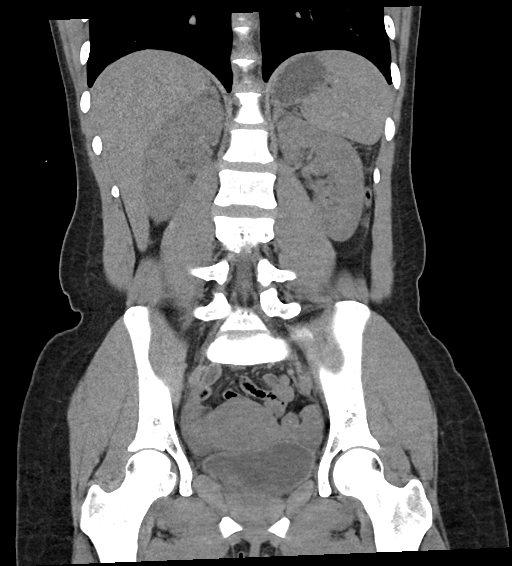

[16 of 46 positions shown; findings below may reference images not displayed]

FINDINGS: Lower chest: Pulmonary micronodule within the right lower and left
lower lobes ( [DATE]). No acute abnormality.

Hepatobiliary: No focal liver abnormality is seen. No gallstones,
gallbladder wall thickening, or biliary dilatation.

Pancreas: Unremarkable. No pancreatic ductal dilatation or
surrounding inflammatory changes.

Spleen: Normal in size without focal abnormality.

Adrenals/Urinary Tract: No adrenal nodule bilaterally. No
nephrolithiasis, no hydronephrosis, and no contour-deforming renal
mass. No ureterolithiasis or hydroureter. The urinary bladder is
unremarkable.

Stomach/Bowel: Stomach is within normal limits. No evidence of bowel
wall thickening, distention, or inflammatory changes. The cecum is
slightly medialized. The appendix is not definitely identified. Few
scattered sigmoid diverticula.

Vascular/Lymphatic: A left phlebolith identified within the pelvis.
No abdominal aorta or iliac aneurysm. No abdominal, pelvic, or
inguinal lymphadenopathy.

Reproductive: Uterus and bilateral ovaries and adnexal regions are
unremarkable.

Other: Trace simple free fluid within the pelvis. No intraperitoneal
free gas. No organized fluid collection.

Musculoskeletal:

No abdominal wall hernia or abnormality

No suspicious lytic or blastic osseous lesions. No acute displaced
fracture.
IMPRESSION: 1. No hydroureteronephrosis or nephroureterolithiasis bilaterally.
2. Nonspecific trace simple free fluid within the pelvis. The uterus
and bilateral ovaries/adnexal regions are unremarkable.
3. Appendix not definitely identified in a patient with a medialized
cecum.
4. Few scattered sigmoid diverticula with no acute diverticulitis.

## 2022-10-15 ENCOUNTER — Emergency Department (HOSPITAL_COMMUNITY)
Admission: EM | Admit: 2022-10-15 | Discharge: 2022-10-15 | Disposition: A | Discharge status: Home / Self Care | Payer: Medicaid Other | Attending: Emergency Medicine | Admitting: Emergency Medicine

## 2022-10-15 ENCOUNTER — Other Ambulatory Visit: Payer: Self-pay

## 2022-10-15 DIAGNOSIS — R112 Nausea with vomiting, unspecified: Secondary | ICD-10-CM | POA: Diagnosis present

## 2022-10-15 LAB — CBC WITH DIFFERENTIAL/PLATELET
Abs Immature Granulocytes: 0 10*3/uL (ref 0.00–0.07)
Basophils Absolute: 0 10*3/uL (ref 0.0–0.1)
Basophils Relative: 1 %
Eosinophils Absolute: 0.1 10*3/uL (ref 0.0–0.5)
Eosinophils Relative: 2 %
HCT: 38 % (ref 36.0–46.0)
Hemoglobin: 13 g/dL (ref 12.0–15.0)
Immature Granulocytes: 0 %
Lymphocytes Relative: 48 %
Lymphs Abs: 1.6 10*3/uL (ref 0.7–4.0)
MCH: 32.2 pg (ref 26.0–34.0)
MCHC: 34.2 g/dL (ref 30.0–36.0)
MCV: 94.1 fL (ref 80.0–100.0)
Monocytes Absolute: 0.3 10*3/uL (ref 0.1–1.0)
Monocytes Relative: 8 %
Neutro Abs: 1.3 10*3/uL — ABNORMAL LOW (ref 1.7–7.7)
Neutrophils Relative %: 41 %
Platelets: 258 10*3/uL (ref 150–400)
RBC: 4.04 MIL/uL (ref 3.87–5.11)
RDW: 11.3 % — ABNORMAL LOW (ref 11.5–15.5)
WBC: 3.2 10*3/uL — ABNORMAL LOW (ref 4.0–10.5)
nRBC: 0 % (ref 0.0–0.2)

## 2022-10-15 LAB — URINALYSIS, ROUTINE W REFLEX MICROSCOPIC
Bilirubin Urine: NEGATIVE
Glucose, UA: NEGATIVE mg/dL
Hgb urine dipstick: NEGATIVE
Ketones, ur: NEGATIVE mg/dL
Leukocytes,Ua: NEGATIVE
Nitrite: NEGATIVE
Protein, ur: NEGATIVE mg/dL
Specific Gravity, Urine: 1.013 (ref 1.005–1.030)
pH: 6 (ref 5.0–8.0)

## 2022-10-15 LAB — PREGNANCY, URINE: Preg Test, Ur: NEGATIVE

## 2022-10-15 LAB — BASIC METABOLIC PANEL
Anion gap: 5 (ref 5–15)
BUN: 8 mg/dL (ref 6–20)
CO2: 27 mmol/L (ref 22–32)
Calcium: 9.3 mg/dL (ref 8.9–10.3)
Chloride: 105 mmol/L (ref 98–111)
Creatinine, Ser: 0.87 mg/dL (ref 0.44–1.00)
GFR, Estimated: >60 mL/min (ref >60)
Glucose, Bld: 90 mg/dL (ref 70–99)
Potassium: 3.9 mmol/L (ref 3.5–5.1)
Sodium: 137 mmol/L (ref 135–145)

## 2022-10-15 MED ORDER — ONDANSETRON 8 MG PO TBDP
8.0000 mg | ORAL_TABLET | Freq: Once | ORAL | Status: AC
  Administered 2022-10-15: 8 mg via ORAL
  Filled 2022-10-15: qty 1

## 2022-10-15 MED ORDER — ONDANSETRON 8 MG PO TBDP: 8.0000 mg | ORAL_TABLET | Freq: Three times a day (TID) | ORAL | 0 refills | Status: DC | PRN

## 2022-10-15 NOTE — ED Provider Triage Note (Incomplete)
Emergency Medicine Provider Triage Evaluation Note  Mckenzie Mccoy , a 31 y.o. female  was evaluated in triage.  Pt complains of nausea, vomiting and diarrhea onset two days ago. No additional vomiting after initial onset two days ago, no additional diarrhea since yesterday. Denies abdominal pain, feverNo known recent sick contacts  Review of Systems  Positive: Nausea, vomiting, diarrhea, chills Negative: Abdominal pain, fever  Physical Exam  BP 119/77 (BP Location: Right Arm)   Pulse (!) 50   Temp 98.9 F (37.2 C) (Oral)   Resp 18   Ht 5\' 10"  (1.778 m)   Wt 83 kg   LMP 10/09/2022   SpO2 100%   BMI 26.26 kg/m  Gen:   Awake, no distress   Resp:  Normal effort  MSK:   Moves extremities without difficulty  Other:  ***  Medical Decision Making  Medically screening exam initiated at 3:21 PM.  Appropriate orders placed.  10/11/2022 was informed that the remainder of the evaluation will be completed by another provider, this initial triage assessment does not replace that evaluation, and the importance of remaining in the ED until their evaluation is complete.  ***

## 2022-10-15 NOTE — Discharge Instructions (Addendum)
Drink plenty of fluids to stay hydrated.  Take the antinausea medication as needed if you have any recurrent symptoms.  Return to the ER for fevers chills worsening symptoms

## 2022-10-15 NOTE — ED Provider Notes (Signed)
[facility]-EMERGENCY DEPT Provider Note   CSN: [id number] Arrival date & time: 10/15/22  1504     History  Chief Complaint  Patient presents with   Emesis   Diarrhea    Mckenzie Mccoy is a 31 y.o. female.   Emesis Associated symptoms: diarrhea   Diarrhea Associated symptoms: vomiting      Patient has a history of iron deficiency anemia.  She presents to the ED with complaints of vomiting diarrhea chills.  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  She had multiple episodes of vomiting and diarrhea over the last couple of days.  Patient states she stopped having diarrhea yesterday but she continued to have nausea and vomiting today.  She was not able to keep anything down.  However waiting for ED evaluation her nausea and vomiting has resolved.  She has been able to eat some crackers and drink some fluids without any difficulty.  He denies any abdominal pain.  No blood in her stool or emesis  Home Medications Prior to Admission medications   Medication Sig Start Date End Date Taking? Authorizing Provider  ondansetron (ZOFRAN-ODT) 8 MG disintegrating tablet Take 1 tablet (8 mg total) by mouth every 8 (eight) hours as needed for nausea or vomiting. 10/15/22  Yes Linwood Dibbles, MD      Allergies    Penicillins    Review of Systems   Review of Systems  Gastrointestinal:  Positive for diarrhea and vomiting.    Physical Exam Updated Vital Signs BP 125/74 (BP Location: Right Arm)   Pulse (!) 44   Temp 98.8 F (37.1 C) (Oral)   Resp 18   Ht 1.778 m (5\' 10" )   Wt 83 kg   LMP 10/09/2022   SpO2 100%   BMI 26.26 kg/m  Physical Exam Vitals and nursing note reviewed.  Constitutional:      General: She is not in acute distress.    Appearance: She is well-developed.  HENT:     Head: Normocephalic and atraumatic.     Right Ear: External ear normal.     Left Ear: External ear normal.  Eyes:     General: No  scleral icterus.       Right eye: No discharge.        Left eye: No discharge.     Conjunctiva/sclera: Conjunctivae normal.  Neck:     Trachea: No tracheal deviation.  Cardiovascular:     Rate and Rhythm: Normal rate and regular rhythm.  Pulmonary:     Effort: Pulmonary effort is normal. No respiratory distress.     Breath sounds: Normal breath sounds. No stridor. No wheezing or rales.  Abdominal:     General: Bowel sounds are normal. There is no distension.     Palpations: Abdomen is soft.     Tenderness: There is no abdominal tenderness. There is no guarding or rebound.  Musculoskeletal:        General: No tenderness or deformity.     Cervical back: Neck supple.  Skin:    General: Skin is warm and dry.     Findings: No rash.  Neurological:     General: No focal deficit present.     Mental Status: She is alert.     Cranial Nerves: No cranial nerve deficit (no facial droop, extraocular movements intact, no slurred speech).     Sensory: No sensory deficit.     Motor: No  abnormal muscle tone or seizure activity.     Coordination: Coordination normal.  Psychiatric:        Mood and Affect: Mood normal.     ED Results / Procedures / Treatments   Labs (all labs ordered are listed, but only abnormal results are displayed) Labs Reviewed  URINALYSIS, ROUTINE W REFLEX MICROSCOPIC - Abnormal; Notable for the following components:      Result Value   APPearance HAZY (*)    All other components within normal limits  CBC WITH DIFFERENTIAL/PLATELET - Abnormal; Notable for the following components:   WBC 3.2 (*)    RDW 11.3 (*)    Neutro Abs 1.3 (*)    All other components within normal limits  PREGNANCY, URINE  BASIC METABOLIC PANEL    EKG None  Radiology No results found.  Procedures Procedures    Medications Ordered in ED Medications  ondansetron (ZOFRAN-ODT) disintegrating tablet 8 mg (8 mg Oral Given 10/15/22 1533)    ED Course/ Medical Decision Making/ A&P                            Medical Decision Making Risk Prescription drug management.   She presented with nausea vomiting and diarrhea.  Abdominal exam benign.  No findings to suggest appendicitis colitis diverticulitis.  Labs unremarkable.  No signs of severe dehydration or electrolyte abnormality.  Suspect mild food poisoning or viral gastroenteritis.  Patient has been tolerating fluids.  I did offer an IV fluid bolus but she does not feel that is necessary.  She would like to go home.  Will discharge home with prescription for Zofran.  Evaluation and diagnostic testing in the emergency department does not suggest an emergent condition requiring admission or immediate intervention beyond what has been performed at this time.  The patient is safe for discharge and has been instructed to return immediately for worsening symptoms, change in symptoms or any other concerns.       Final Clinical Impression(s) / ED Diagnoses Final diagnoses:  Nausea vomiting and diarrhea    Rx / DC Orders ED Discharge Orders          Ordered    ondansetron (ZOFRAN-ODT) 8 MG disintegrating tablet  Every 8 hours PRN        10/15/22 1836              Linwood Dibbles, MD 10/15/22 [name]

## 2022-10-15 NOTE — ED Notes (Signed)
Patient was able to take fluids without any complaints of nausea or vomiting

## 2022-10-15 NOTE — ED Triage Notes (Signed)
C/o n/v/d x1 day.  Denies pain, fever, headache

## 2024-06-06 ENCOUNTER — Other Ambulatory Visit: Payer: Self-pay

## 2024-06-06 ENCOUNTER — Encounter (HOSPITAL_COMMUNITY): Payer: Self-pay | Admitting: *Deleted

## 2024-06-06 ENCOUNTER — Ambulatory Visit (HOSPITAL_COMMUNITY)
Admission: EM | Admit: 2024-06-06 | Discharge: 2024-06-06 | Disposition: A | Discharge status: Home / Self Care | Payer: Self-pay

## 2024-06-06 DIAGNOSIS — J029 Acute pharyngitis, unspecified: Secondary | ICD-10-CM

## 2024-06-06 LAB — POCT RAPID STREP A (OFFICE): Rapid Strep A Screen: NEGATIVE

## 2024-06-06 NOTE — Discharge Instructions (Addendum)
 Your Strep Test is negative. We encourage conservative treatment with symptom relief. We encourage you to use Tylenol  for your pain (Remember to use as directed do not exceed daily dosing recommendations). We also encourage salt water gargles for your sore throat. You should also consider throat lozenges and chloraseptic spray.   Urgent care is not able to complete a occupational health evaluation.  This needs to be completed with occupational health.  They need to do an EKG or provide the patient with information or an appointment to have this completed.   A primary care referral has been placed for him is in for future but this may take days upon weeks.

## 2024-06-06 NOTE — ED Triage Notes (Signed)
 C/O sore throat x 3 days with slight cough. Denies fevers. States needs to know if she's OK to return to work. Has held off on vaping for past 3 days.

## 2024-06-06 NOTE — ED Provider Notes (Signed)
 [facility]    CSN: [id number] Arrival date & time: 06/06/24  0805      History   Chief Complaint Chief Complaint  Patient presents with   Sore Throat   Cough    HPI ASLAN MONTAGNA is a 33 y.o. female.   HPI  She is in today for evaluation of sore throat.  She endorses that she has a 42 year old son who is currently in daycare.  She denies any head congestion, fever, chills headache, dizziness, chest pain, sore throat.  She reports that she does vape but has not been vaping in the last several weeks.  She reports that she was seen by occupational health for a wellness visit physical and was not cleared to because abnormalities heard by provider.  She is wanting to know if we are able to provide her clearance. 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 Authorizing Provider  Cetirizine HCl (ZYRTEC PO) Take by mouth.   Yes [provider]    Family History History reviewed. No pertinent family history.  Social History Social History   Tobacco Use   Smoking status: Former    Current packs/day: 0.25    Types: Cigarettes   Smokeless tobacco: Never  Vaping Use   Vaping status: Every Day  Substance Use Topics   Alcohol use: Yes    Comment: socially   Drug use: No     Allergies   Penicillins   Review of Systems Review of Systems   Physical Exam Triage Vital Signs ED Triage Vitals [06/06/24 0836]  Encounter Vitals Group     BP (!) 149/88     Girls Systolic BP  Percentile      Girls Diastolic BP Percentile      Boys Systolic BP Percentile      Boys Diastolic BP Percentile      Pulse Rate (!) 53     Resp 16     Temp 98 F (36.7 C)     Temp Source Oral     SpO2 99 %     Weight      Height      Head Circumference      Peak Flow      Pain Score 3     Pain Loc      Pain Education      Exclude from Growth Chart    No data found.  Updated Vital Signs BP (!) 149/88   Pulse (!) 53   Temp 98 F (36.7 C) (Oral)   Resp 16   SpO2 99%   Visual Acuity Right Eye Distance:   Left Eye Distance:  Bilateral Distance:    Right Eye Near:   Left Eye Near:    Bilateral Near:     Physical Exam Constitutional:      Appearance: She is normal weight.  HENT:     Head: Normocephalic.     Mouth/Throat:     Mouth: Mucous membranes are moist.     Tonsils: No tonsillar exudate or tonsillar abscesses.  Eyes:     Conjunctiva/sclera: Conjunctivae normal.  Cardiovascular:     Rate and Rhythm: Normal rate. Rhythm irregular.  Pulmonary:     Effort: Pulmonary effort is normal.  Musculoskeletal:     Cervical back: Normal range of motion.  Skin:    General: Skin is warm and dry.     Capillary Refill: Capillary refill takes less than 2 seconds.  Neurological:     General: No focal deficit present.     Mental Status: She is alert and oriented to person, place, and time.      UC Treatments / Results  Labs (all labs ordered are listed, but only abnormal results are displayed) Labs Reviewed  POCT RAPID STREP A (OFFICE) - Normal    EKG   Radiology No results found.  Procedures Procedures (including critical care time)  Medications Ordered in UC Medications - No data to display  Initial Impression / Assessment and Plan / UC Course  I have reviewed the triage vital signs and the nursing notes.  Pertinent labs & imaging results that were available during my care of the patient were reviewed by me and considered in my medical decision  making (see chart for details).     Sore throat Final Clinical Impressions(s) / UC Diagnoses   Final diagnoses:  Sore throat     Discharge Instructions      Your Strep Test is negative. We encourage conservative treatment with symptom relief. We encourage you to use Tylenol  for your pain (Remember to use as directed do not exceed daily dosing recommendations). We also encourage salt water gargles for your sore throat. You should also consider throat lozenges and chloraseptic spray.      ED Prescriptions   None    PDMP not reviewed this encounter.   Myrna Salines [place], [place] 06/06/24 [id number]
